# Patient Record
Sex: Female | Born: 1950 | Race: White | Hispanic: No | Marital: Single | Smoking: Never smoker
Health system: Southern US, Community
[De-identification: ages and names within clinical notes are randomized; demographics above are authoritative.]

## PROBLEM LIST (undated history)

## (undated) DIAGNOSIS — R51 Headache: Secondary | ICD-10-CM

## (undated) DIAGNOSIS — M65312 Trigger thumb, left thumb: Secondary | ICD-10-CM

## (undated) DIAGNOSIS — R112 Nausea with vomiting, unspecified: Secondary | ICD-10-CM

## (undated) DIAGNOSIS — H04123 Dry eye syndrome of bilateral lacrimal glands: Secondary | ICD-10-CM

## (undated) DIAGNOSIS — Z9889 Other specified postprocedural states: Secondary | ICD-10-CM

## (undated) DIAGNOSIS — E559 Vitamin D deficiency, unspecified: Secondary | ICD-10-CM

## (undated) DIAGNOSIS — J45909 Unspecified asthma, uncomplicated: Secondary | ICD-10-CM

## (undated) DIAGNOSIS — M549 Dorsalgia, unspecified: Secondary | ICD-10-CM

## (undated) DIAGNOSIS — E663 Overweight: Secondary | ICD-10-CM

## (undated) DIAGNOSIS — R519 Headache, unspecified: Secondary | ICD-10-CM

## (undated) DIAGNOSIS — M1990 Unspecified osteoarthritis, unspecified site: Secondary | ICD-10-CM

## (undated) DIAGNOSIS — G43909 Migraine, unspecified, not intractable, without status migrainosus: Secondary | ICD-10-CM

## (undated) HISTORY — DX: Dorsalgia, unspecified: M54.9

## (undated) HISTORY — DX: Overweight: E66.3

## (undated) HISTORY — PX: ABDOMINOPLASTY: SUR9

## (undated) HISTORY — DX: Dry eye syndrome of bilateral lacrimal glands: H04.123

## (undated) HISTORY — PX: TYMPANOSTOMY: SHX2586

## (undated) HISTORY — DX: Migraine, unspecified, not intractable, without status migrainosus: G43.909

## (undated) HISTORY — DX: Vitamin D deficiency, unspecified: E55.9

## (undated) HISTORY — PX: FACIAL COSMETIC SURGERY: SHX629

---

## 1998-11-02 ENCOUNTER — Other Ambulatory Visit: Admission: RE | Admit: 1998-11-02 | Discharge: 1998-11-02 | Payer: Self-pay | Admitting: Family Medicine

## 1998-12-06 ENCOUNTER — Ambulatory Visit (HOSPITAL_COMMUNITY): Admission: RE | Admit: 1998-12-06 | Discharge: 1998-12-06 | Payer: Self-pay | Admitting: Gastroenterology

## 1999-03-22 ENCOUNTER — Ambulatory Visit (HOSPITAL_BASED_OUTPATIENT_CLINIC_OR_DEPARTMENT_OTHER): Admission: RE | Admit: 1999-03-22 | Discharge: 1999-03-22 | Payer: Self-pay | Admitting: Plastic Surgery

## 1999-11-08 ENCOUNTER — Other Ambulatory Visit: Admission: RE | Admit: 1999-11-08 | Discharge: 1999-11-08 | Payer: Self-pay | Admitting: Family Medicine

## 2000-10-28 ENCOUNTER — Other Ambulatory Visit: Admission: RE | Admit: 2000-10-28 | Discharge: 2000-10-28 | Payer: Self-pay | Admitting: Family Medicine

## 2001-11-11 ENCOUNTER — Other Ambulatory Visit: Admission: RE | Admit: 2001-11-11 | Discharge: 2001-11-11 | Payer: Self-pay | Admitting: Family Medicine

## 2002-11-26 ENCOUNTER — Other Ambulatory Visit: Admission: RE | Admit: 2002-11-26 | Discharge: 2002-11-26 | Payer: Self-pay | Admitting: Family Medicine

## 2004-02-27 ENCOUNTER — Other Ambulatory Visit: Admission: RE | Admit: 2004-02-27 | Discharge: 2004-02-27 | Payer: Self-pay | Admitting: Family Medicine

## 2005-04-01 ENCOUNTER — Other Ambulatory Visit: Admission: RE | Admit: 2005-04-01 | Discharge: 2005-04-01 | Payer: Self-pay | Admitting: Family Medicine

## 2005-04-18 ENCOUNTER — Encounter: Admission: RE | Admit: 2005-04-18 | Discharge: 2005-04-18 | Payer: Self-pay | Admitting: Family Medicine

## 2006-05-28 ENCOUNTER — Other Ambulatory Visit: Admission: RE | Admit: 2006-05-28 | Discharge: 2006-05-28 | Payer: Self-pay | Admitting: Family Medicine

## 2008-09-05 ENCOUNTER — Other Ambulatory Visit: Admission: RE | Admit: 2008-09-05 | Discharge: 2008-09-05 | Payer: Self-pay | Admitting: Family Medicine

## 2013-06-09 ENCOUNTER — Other Ambulatory Visit (HOSPITAL_COMMUNITY)
Admission: RE | Admit: 2013-06-09 | Discharge: 2013-06-09 | Disposition: A | Discharge status: Home / Self Care | Payer: 59 | Source: Ambulatory Visit | Attending: Family Medicine | Admitting: Family Medicine

## 2013-06-09 ENCOUNTER — Other Ambulatory Visit: Payer: Self-pay | Admitting: Family Medicine

## 2013-06-09 DIAGNOSIS — Z1151 Encounter for screening for human papillomavirus (HPV): Secondary | ICD-10-CM | POA: Insufficient documentation

## 2013-06-09 DIAGNOSIS — R1011 Right upper quadrant pain: Secondary | ICD-10-CM

## 2013-06-09 DIAGNOSIS — Z01419 Encounter for gynecological examination (general) (routine) without abnormal findings: Secondary | ICD-10-CM | POA: Insufficient documentation

## 2013-06-24 ENCOUNTER — Ambulatory Visit
Admission: RE | Admit: 2013-06-24 | Discharge: 2013-06-24 | Disposition: A | Discharge status: Home / Self Care | Payer: 59 | Source: Ambulatory Visit | Attending: Family Medicine | Admitting: Family Medicine

## 2013-06-24 DIAGNOSIS — R1011 Right upper quadrant pain: Secondary | ICD-10-CM

## 2014-03-17 ENCOUNTER — Other Ambulatory Visit: Payer: Self-pay | Admitting: Family Medicine

## 2014-03-17 DIAGNOSIS — I890 Lymphedema, not elsewhere classified: Secondary | ICD-10-CM

## 2014-03-21 ENCOUNTER — Ambulatory Visit
Admission: RE | Admit: 2014-03-21 | Discharge: 2014-03-21 | Disposition: A | Discharge status: Home / Self Care | Payer: 59 | Source: Ambulatory Visit | Attending: Family Medicine | Admitting: Family Medicine

## 2014-03-21 ENCOUNTER — Other Ambulatory Visit: Payer: Self-pay | Admitting: Family Medicine

## 2014-03-21 DIAGNOSIS — I890 Lymphedema, not elsewhere classified: Secondary | ICD-10-CM

## 2014-03-21 MED ORDER — IOHEXOL 300 MG/ML  SOLN
125.0000 mL | Freq: Once | INTRAMUSCULAR | Status: AC | PRN
  Administered 2014-03-21: 125 mL via INTRAVENOUS

## 2015-01-27 IMAGING — CT CT ABD-PELV W/ CM
2 of 5 series · 17 of 46 positions shown, 19 images · IV contrast (READICAT/WATER & [ID] OMNI 300)
Comparison: None.

CLINICAL DATA: Bilateral lower extremity lymphedema

EXAM:
CT ABDOMEN AND PELVIS WITH CONTRAST
TECHNIQUE: Multidetector CT imaging of the abdomen and pelvis was performed
using the standard protocol following bolus administration of
intravenous contrast.
CONTRAST:  125mL OMNIPAQUE IOHEXOL 300 MG/ML  SOLN

[Series 2: abd/pelvis with · axial · 0.74mm/px · z∈[-439,-34]mm · 14 of 90 slices shown, 16 images]
[im 5/90  soft-tissue]
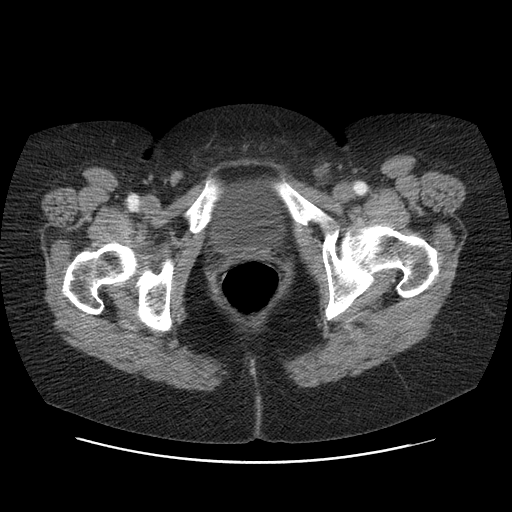
[im 5/90  bone]
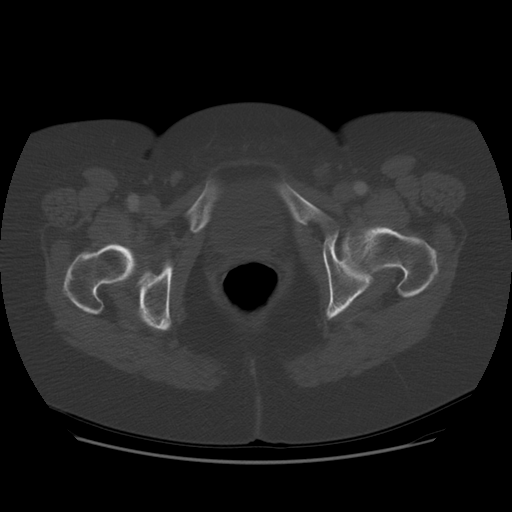
[im 10/90  soft-tissue]
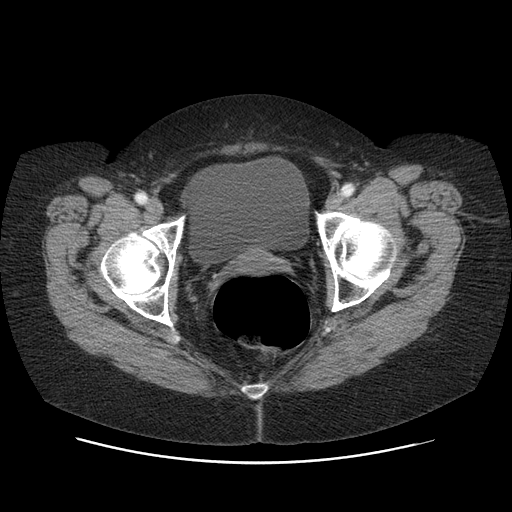
[im 20/90  soft-tissue]
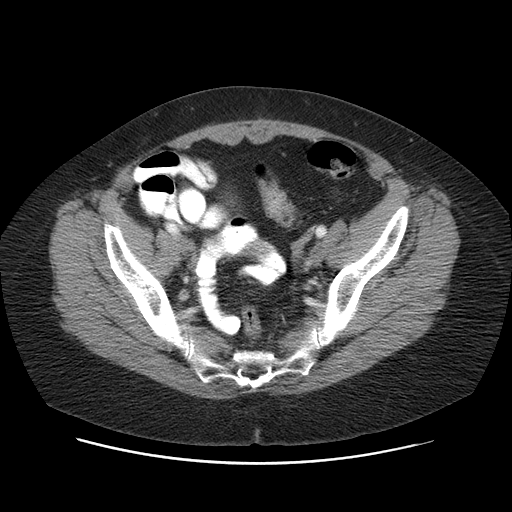
[im 25/90  soft-tissue]
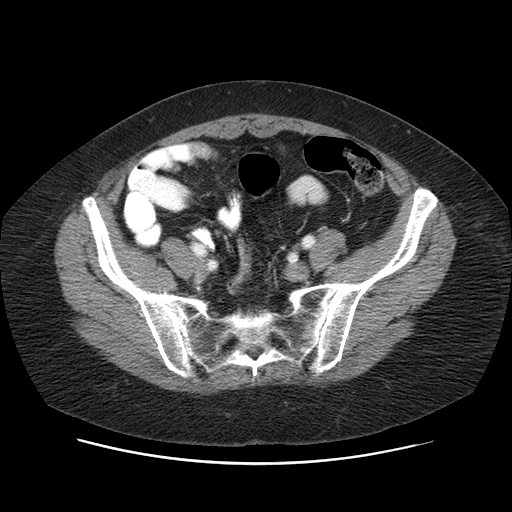
[im 30/90  soft-tissue]
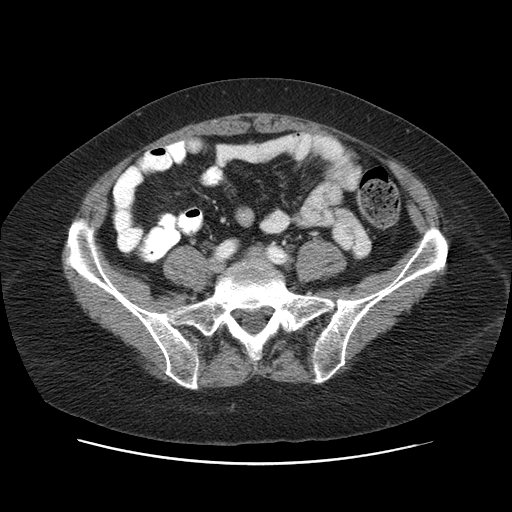
[im 35/90  soft-tissue]
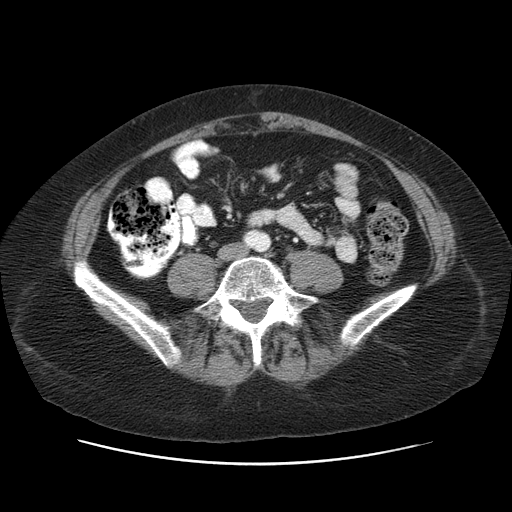
[im 40/90  soft-tissue]
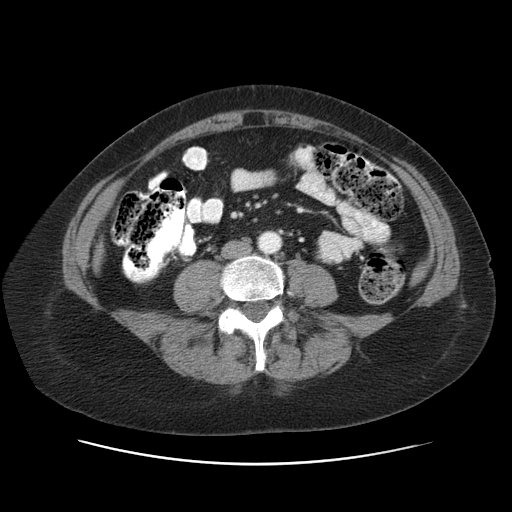
[im 50/90  soft-tissue]
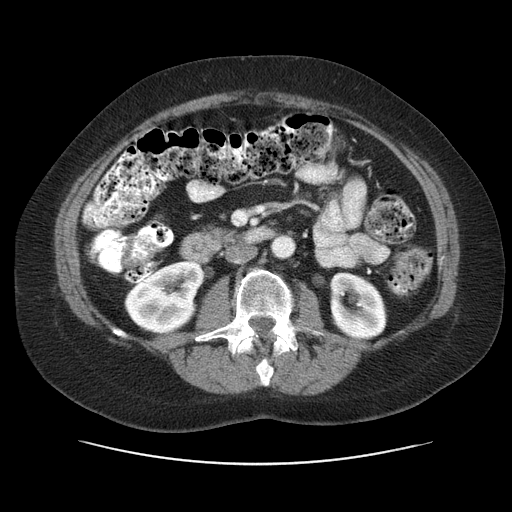
[im 55/90  soft-tissue]
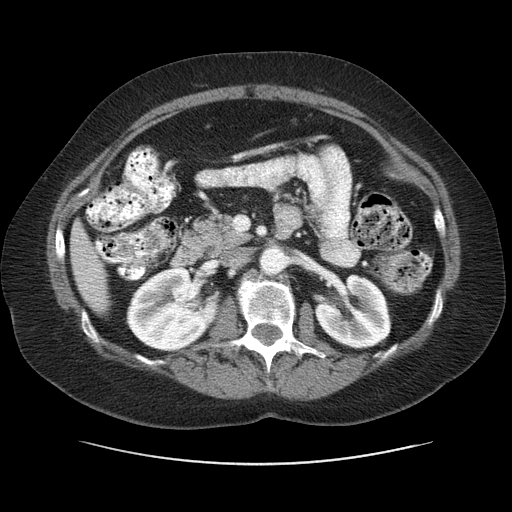
[im 55/90  bone]
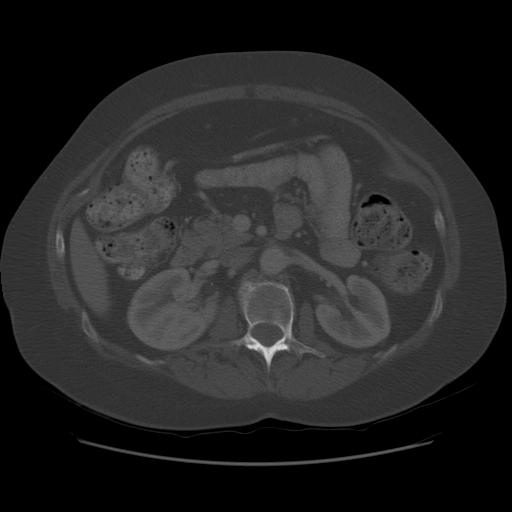
[im 60/90  soft-tissue]
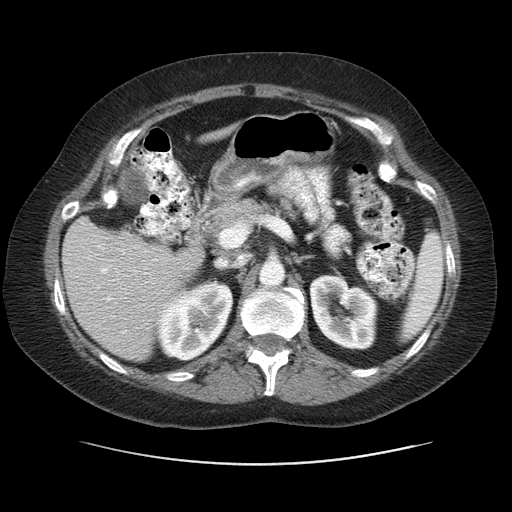
[im 65/90  soft-tissue]
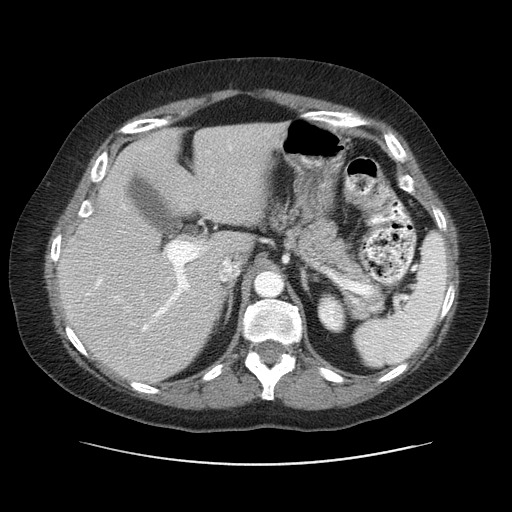
[im 70/90  soft-tissue]
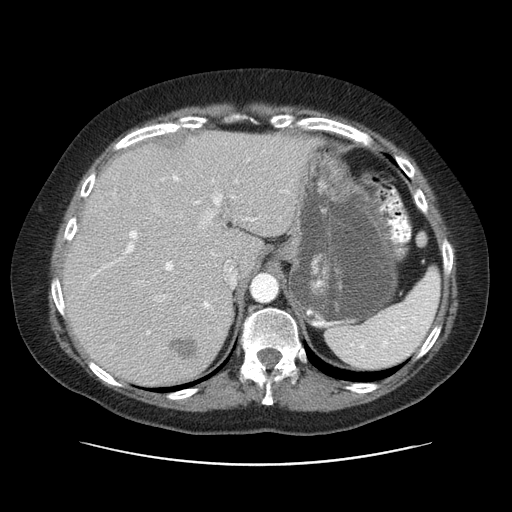
[im 80/90  soft-tissue]
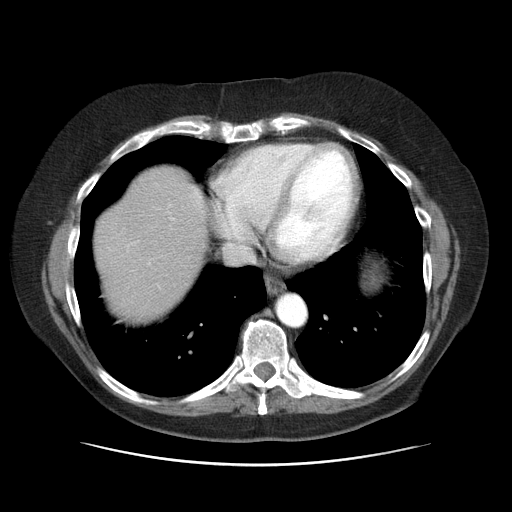
[im 85/90  soft-tissue]
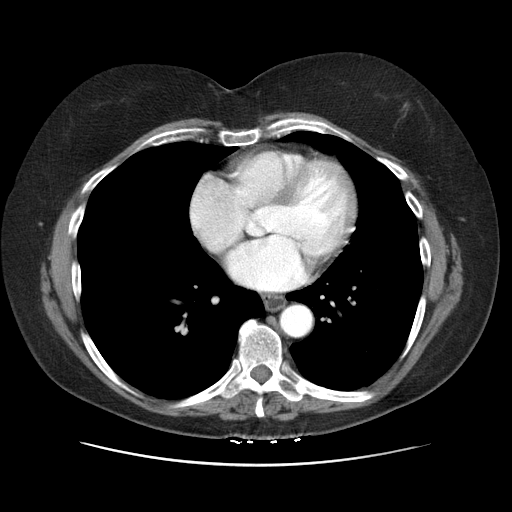

[Series 400: cor · coronal · 1.02mm/px · 3 of 134 slices shown]
[im 45/134  soft-tissue]
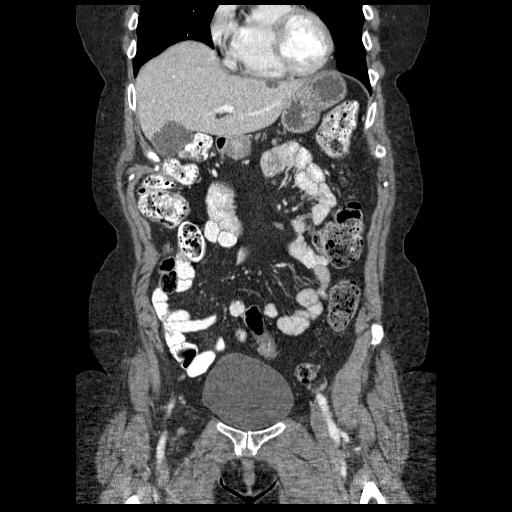
[im 60/134  soft-tissue]
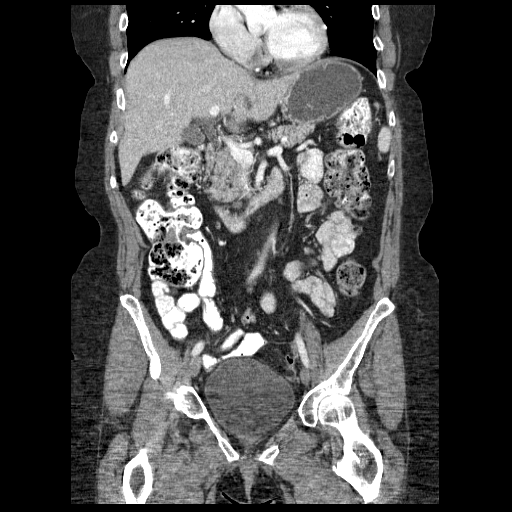
[im 74/134  soft-tissue]
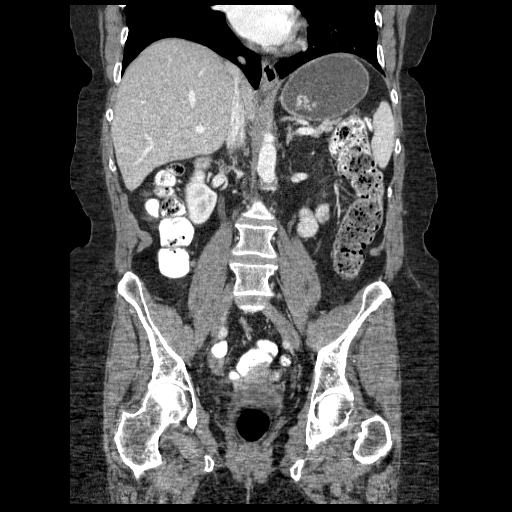

[17 of 46 positions shown; findings below may reference images not displayed]

FINDINGS: Lung bases are unremarkable. Small hiatal hernia. Heart size is
within normal limits. There is a cyst in left hepatic lobe measures
1.3 cm. Probable hemangioma in the right hepatic lobe posteriorly
measures 1.6 cm. Additional tiny hepatic cysts are noted. The
pancreas, spleen and adrenal glands are unremarkable. Small
accessory splenule. No aortic aneurysm. Kidneys are symmetrical in
size and enhancement. No hydronephrosis or hydroureter.

Delayed renal images shows bilateral renal symmetrical excretion.

Moderate colonic stool. No small bowel obstruction. No pericecal
inflammation. The terminal ileum is unremarkable. The appendix is
not identified. Bilateral visualized proximal ureter is
unremarkable.

No ascites or free air. No adenopathy. The uterus and adnexa are
unremarkable. The urinary bladder is unremarkable. Small nonspecific
bilateral inguinal lymph nodes. No pelvic masses noted. No distal
colonic obstruction.

Sagittal images of the spine shows mild degenerative changes
thoracolumbar spine. No destructive bony lesions are noted.
IMPRESSION: 1. No acute inflammatory process within abdomen or pelvis.
2. Moderate colonic stool.
3. Scattered hepatic cysts. Probable hemangioma right hepatic lobe
measures 1.6 cm.
4. No hydronephrosis or hydroureter.
5. No pericecal inflammation.

## 2016-07-02 ENCOUNTER — Other Ambulatory Visit: Payer: Self-pay | Admitting: Family Medicine

## 2016-07-02 DIAGNOSIS — R1011 Right upper quadrant pain: Secondary | ICD-10-CM

## 2016-07-12 ENCOUNTER — Ambulatory Visit
Admission: RE | Admit: 2016-07-12 | Discharge: 2016-07-12 | Disposition: A | Discharge status: Home / Self Care | Payer: 59 | Source: Ambulatory Visit | Attending: Family Medicine | Admitting: Family Medicine

## 2016-07-12 DIAGNOSIS — R1011 Right upper quadrant pain: Secondary | ICD-10-CM

## 2017-05-20 IMAGING — US US ABDOMEN COMPLETE
1 series · 14 of 25 positions shown · non-contrast
Comparison: CT abdomen pelvis of 03/21/2014 and ultrasound of the
abdomen of 06/24/2013

CLINICAL DATA: Intermittent right upper quadrant pain particularly
after eating

EXAM:
ABDOMEN ULTRASOUND COMPLETE

[Series 1: us abdomen complete · 0.30mm/px · 14 of 78 slices shown]
[im 1/78]
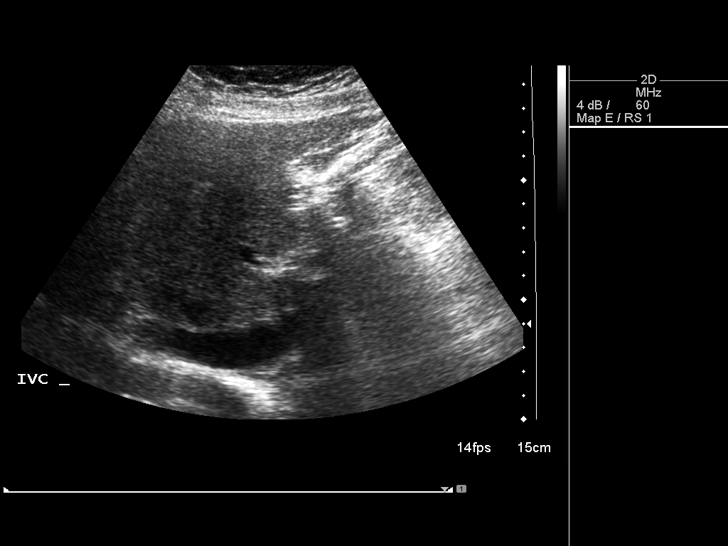
[im 7/78]
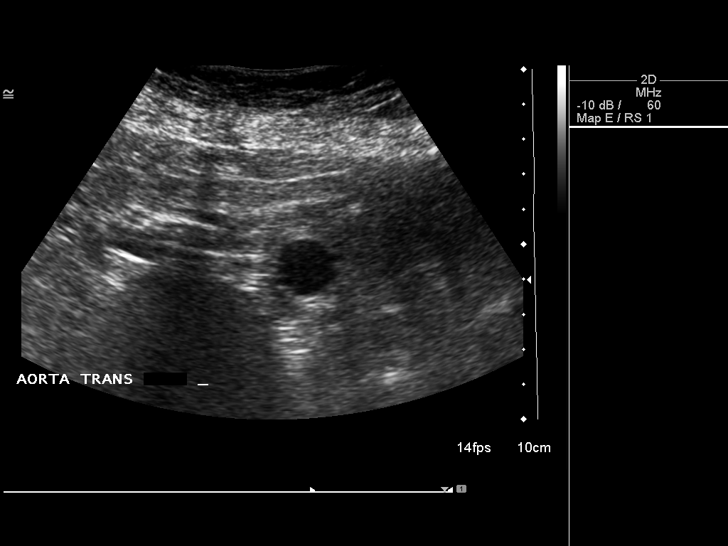
[im 13/78]
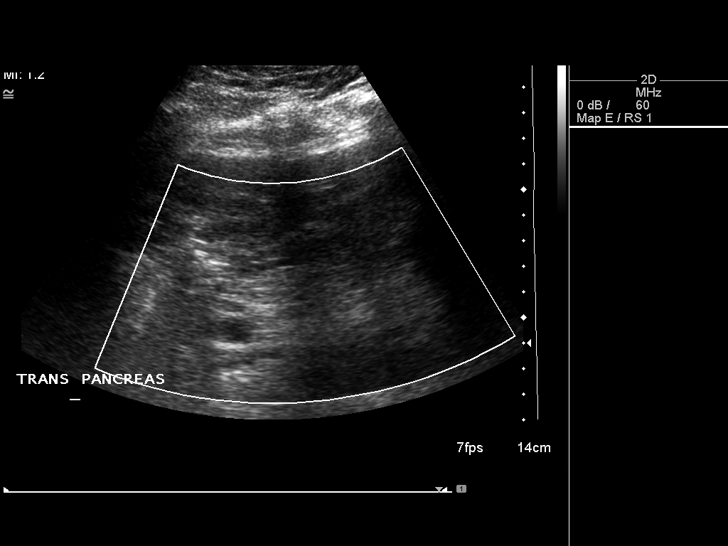
[im 20/78]
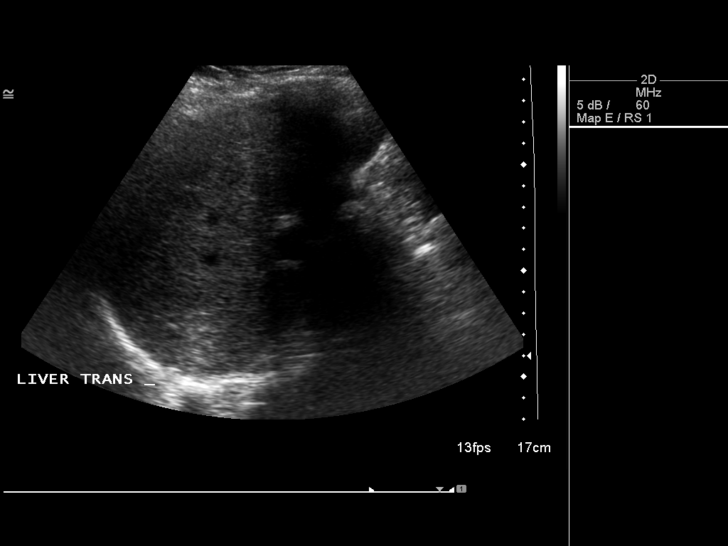
[im 26/78]
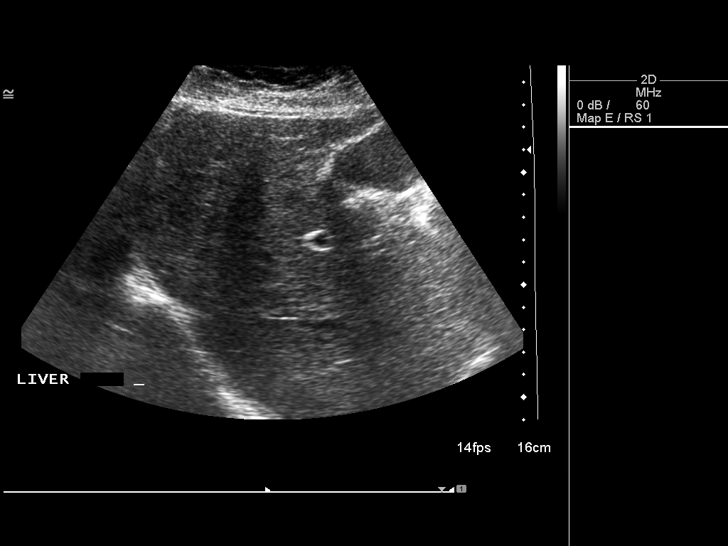
[im 29/78]
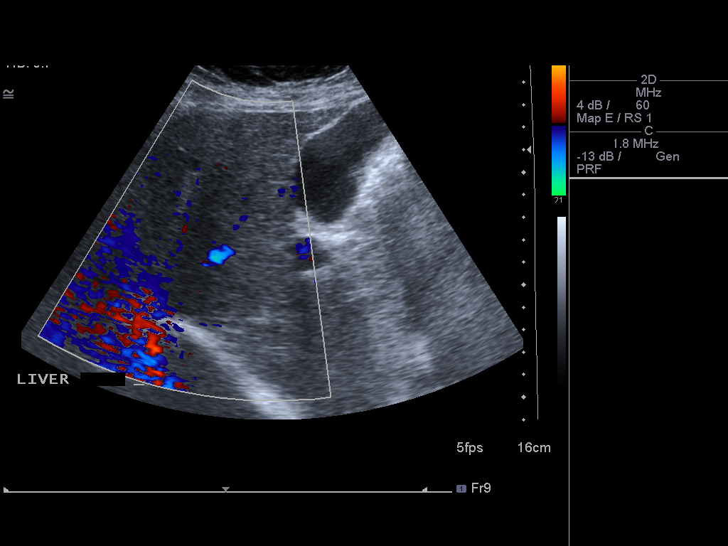
[im 36/78]
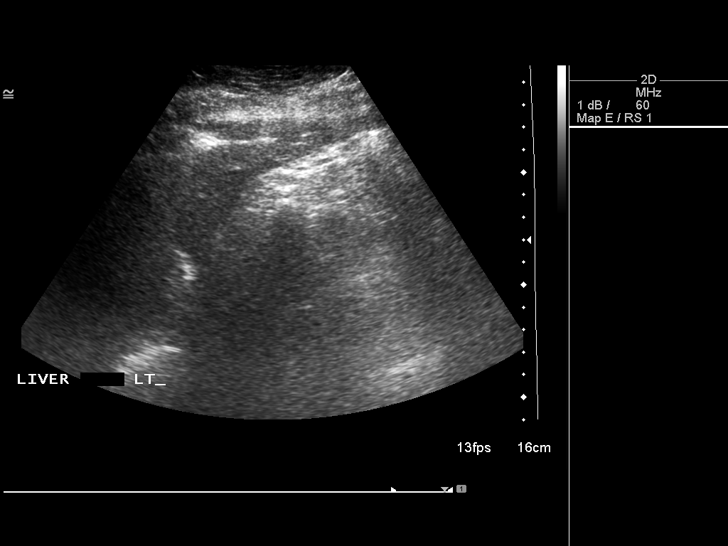
[im 42/78]
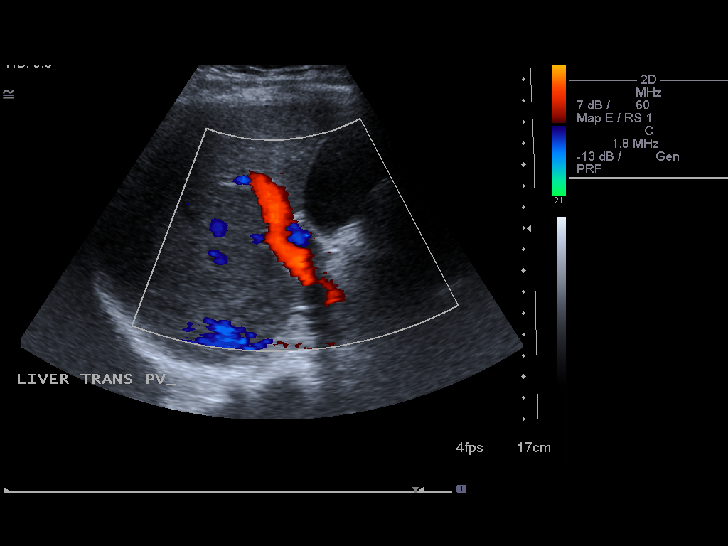
[im 49/78]
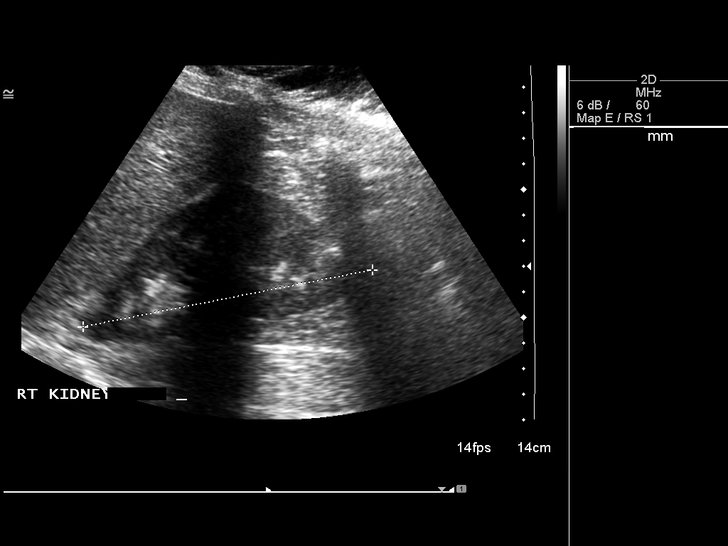
[im 52/78]
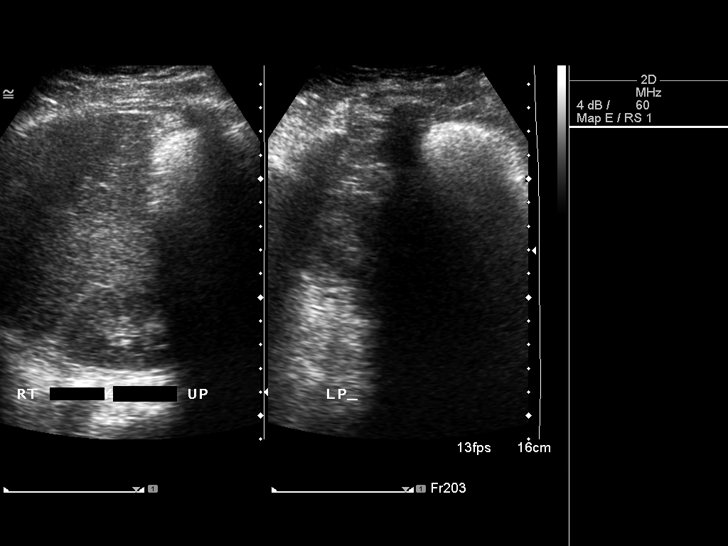
[im 58/78]
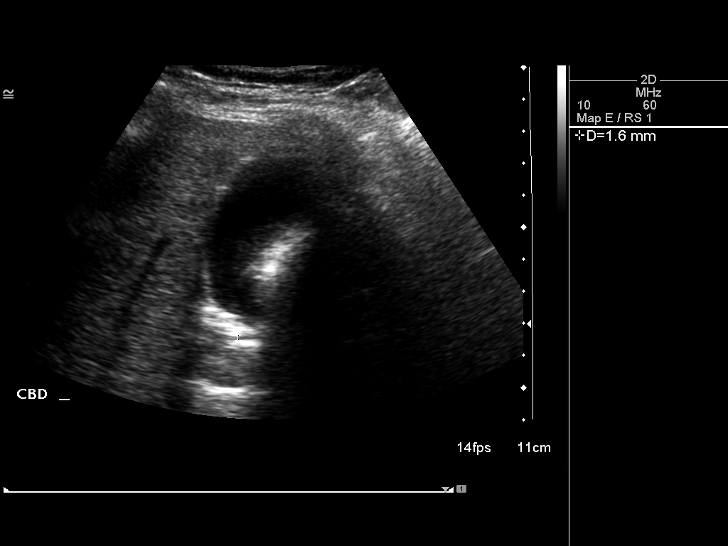
[im 65/78]
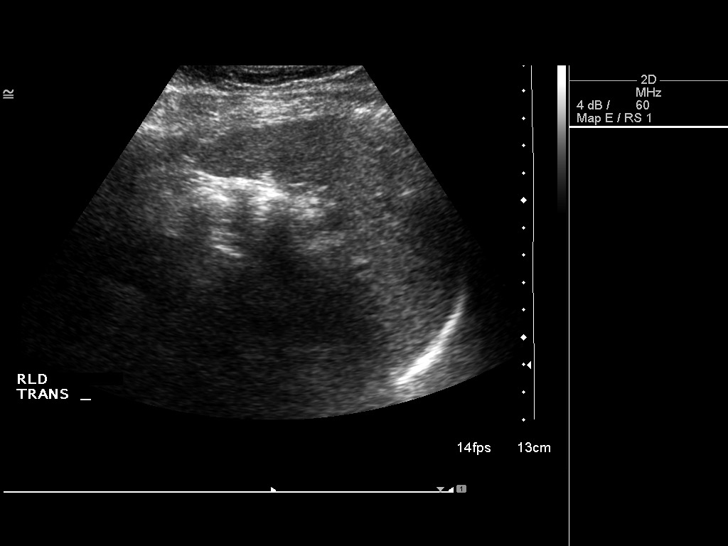
[im 71/78]
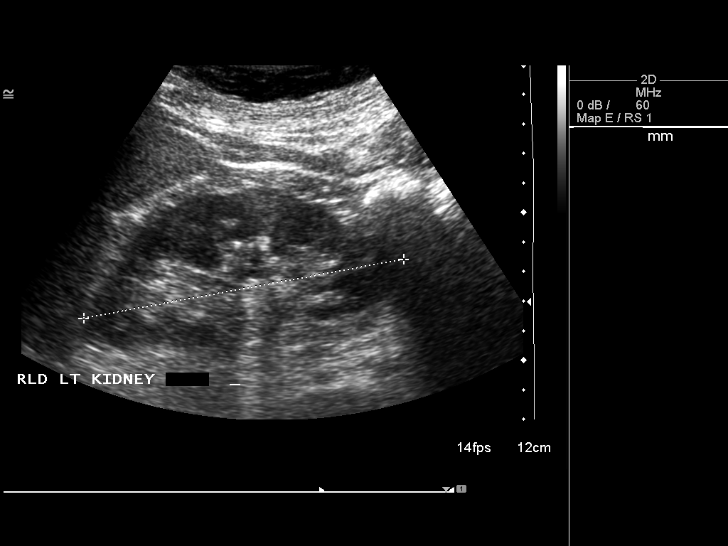
[im 78/78]
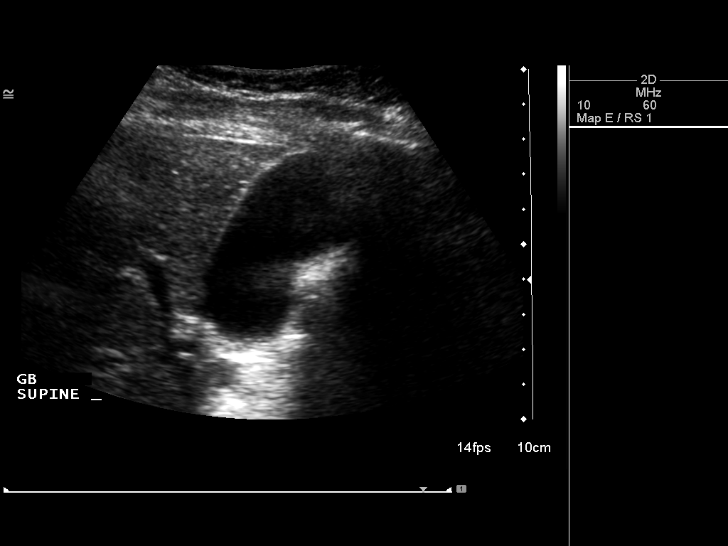

[14 of 25 positions shown; findings below may reference images not displayed]

FINDINGS: Gallbladder: The gallbladder is visualized and no gallstones are
noted. There is no pain over the gallbladder with compression.

Common bile duct: Diameter: The common bile duct measures 1.6 mm in
diameter.

Liver: Several liver cysts are present. Also there appears to be
hemangioma posteriorly in the right lobe near the dome of 2.6 cm
diameter, seen previously by CT.

IVC: The IVC is not well seen due to bowel gas.

Pancreas: Also much of the pancreas is obscured by bowel gas.

Spleen: Spleen measures 8.9 cm.

Right Kidney: Length: 11.8 cm..  No hydronephrosis is seen.

Left Kidney: Length: 11.0 cm..  No hydronephrosis is noted.

Abdominal aorta: The abdominal aorta is normal in caliber.

Other findings: None.
IMPRESSION: 1. No gallstones.
2. No change in hepatic cysts and apparent hemangioma posteriorly in
the right lobe.
3. The pancreas is obscured by bowel gas.

## 2018-07-20 ENCOUNTER — Other Ambulatory Visit: Payer: Self-pay | Admitting: Family Medicine

## 2018-07-20 ENCOUNTER — Other Ambulatory Visit (HOSPITAL_COMMUNITY)
Admission: RE | Admit: 2018-07-20 | Discharge: 2018-07-20 | Disposition: A | Discharge status: Home / Self Care | Payer: Medicare Other | Source: Ambulatory Visit | Attending: Family Medicine | Admitting: Family Medicine

## 2018-07-20 DIAGNOSIS — Z124 Encounter for screening for malignant neoplasm of cervix: Secondary | ICD-10-CM | POA: Diagnosis present

## 2018-07-22 LAB — CYTOLOGY - PAP
Diagnosis: NEGATIVE
HPV: NOT DETECTED

## 2018-08-03 ENCOUNTER — Other Ambulatory Visit: Payer: Self-pay | Admitting: Plastic Surgery

## 2018-08-03 HISTORY — PX: BREAST REDUCTION SURGERY: SHX8

## 2018-09-10 ENCOUNTER — Other Ambulatory Visit: Payer: Self-pay | Admitting: Orthopedic Surgery

## 2018-09-25 ENCOUNTER — Encounter (HOSPITAL_BASED_OUTPATIENT_CLINIC_OR_DEPARTMENT_OTHER): Payer: Self-pay | Admitting: *Deleted

## 2018-09-25 ENCOUNTER — Other Ambulatory Visit: Payer: Self-pay

## 2018-10-05 ENCOUNTER — Encounter (HOSPITAL_BASED_OUTPATIENT_CLINIC_OR_DEPARTMENT_OTHER): Payer: Self-pay | Admitting: *Deleted

## 2018-10-05 ENCOUNTER — Encounter (HOSPITAL_BASED_OUTPATIENT_CLINIC_OR_DEPARTMENT_OTHER)
Admission: RE | Disposition: A | Discharge status: Home / Self Care | Payer: Self-pay | Source: Home / Self Care | Attending: Orthopedic Surgery

## 2018-10-05 ENCOUNTER — Ambulatory Visit (HOSPITAL_BASED_OUTPATIENT_CLINIC_OR_DEPARTMENT_OTHER): Payer: Medicare Other | Admitting: Anesthesiology

## 2018-10-05 ENCOUNTER — Ambulatory Visit (HOSPITAL_BASED_OUTPATIENT_CLINIC_OR_DEPARTMENT_OTHER)
Admission: RE | Admit: 2018-10-05 | Discharge: 2018-10-05 | Disposition: A | Discharge status: Home / Self Care | Payer: Medicare Other | Attending: Orthopedic Surgery | Admitting: Orthopedic Surgery

## 2018-10-05 DIAGNOSIS — M1990 Unspecified osteoarthritis, unspecified site: Secondary | ICD-10-CM | POA: Insufficient documentation

## 2018-10-05 DIAGNOSIS — Z6833 Body mass index (BMI) 33.0-33.9, adult: Secondary | ICD-10-CM | POA: Diagnosis not present

## 2018-10-05 DIAGNOSIS — E669 Obesity, unspecified: Secondary | ICD-10-CM | POA: Insufficient documentation

## 2018-10-05 DIAGNOSIS — J45909 Unspecified asthma, uncomplicated: Secondary | ICD-10-CM | POA: Diagnosis not present

## 2018-10-05 DIAGNOSIS — M65312 Trigger thumb, left thumb: Secondary | ICD-10-CM | POA: Insufficient documentation

## 2018-10-05 HISTORY — DX: Other specified postprocedural states: Z98.890

## 2018-10-05 HISTORY — DX: Unspecified osteoarthritis, unspecified site: M19.90

## 2018-10-05 HISTORY — DX: Unspecified asthma, uncomplicated: J45.909

## 2018-10-05 HISTORY — DX: Headache: R51

## 2018-10-05 HISTORY — DX: Trigger thumb, left thumb: M65.312

## 2018-10-05 HISTORY — DX: Headache, unspecified: R51.9

## 2018-10-05 HISTORY — DX: Nausea with vomiting, unspecified: R11.2

## 2018-10-05 HISTORY — PX: TRIGGER FINGER RELEASE: SHX641

## 2018-10-05 SURGERY — RELEASE, A1 PULLEY, FOR TRIGGER FINGER
Anesthesia: Monitor Anesthesia Care | Site: Thumb | Laterality: Left

## 2018-10-05 MED ORDER — MIDAZOLAM HCL 2 MG/2ML IJ SOLN
1.0000 mg | INTRAMUSCULAR | Status: DC | PRN
  Administered 2018-10-05: 2 mg via INTRAVENOUS

## 2018-10-05 MED ORDER — LIDOCAINE 2% (20 MG/ML) 5 ML SYRINGE
INTRAMUSCULAR | Status: AC
  Filled 2018-10-05: qty 5

## 2018-10-05 MED ORDER — FENTANYL CITRATE (PF) 100 MCG/2ML IJ SOLN
INTRAMUSCULAR | Status: AC
  Filled 2018-10-05: qty 2

## 2018-10-05 MED ORDER — LACTATED RINGERS IV SOLN
INTRAVENOUS | Status: DC
  Administered 2018-10-05: 13:00:00 via INTRAVENOUS

## 2018-10-05 MED ORDER — HYDROCODONE-ACETAMINOPHEN 5-325 MG PO TABS: ORAL_TABLET | ORAL | 0 refills | Status: DC

## 2018-10-05 MED ORDER — ONDANSETRON HCL 4 MG/2ML IJ SOLN
INTRAMUSCULAR | Status: DC | PRN
  Administered 2018-10-05: 4 mg via INTRAVENOUS

## 2018-10-05 MED ORDER — SCOPOLAMINE 1 MG/3DAYS TD PT72: 1.0000 | MEDICATED_PATCH | Freq: Once | TRANSDERMAL | Status: DC | PRN

## 2018-10-05 MED ORDER — LIDOCAINE HCL (PF) 0.5 % IJ SOLN: INTRAMUSCULAR | Status: DC | PRN

## 2018-10-05 MED ORDER — CEFAZOLIN SODIUM-DEXTROSE 2-4 GM/100ML-% IV SOLN
2.0000 g | INTRAVENOUS | Status: AC
  Administered 2018-10-05: 2 g via INTRAVENOUS

## 2018-10-05 MED ORDER — ONDANSETRON HCL 4 MG/2ML IJ SOLN: 4.0000 mg | Freq: Once | INTRAMUSCULAR | Status: DC | PRN

## 2018-10-05 MED ORDER — FENTANYL CITRATE (PF) 100 MCG/2ML IJ SOLN: 25.0000 ug | INTRAMUSCULAR | Status: DC | PRN

## 2018-10-05 MED ORDER — MIDAZOLAM HCL 2 MG/2ML IJ SOLN
INTRAMUSCULAR | Status: AC
  Filled 2018-10-05: qty 2

## 2018-10-05 MED ORDER — ONDANSETRON HCL 4 MG/2ML IJ SOLN
INTRAMUSCULAR | Status: AC
  Filled 2018-10-05: qty 2

## 2018-10-05 MED ORDER — CHLORHEXIDINE GLUCONATE 4 % EX LIQD: 60.0000 mL | Freq: Once | CUTANEOUS | Status: DC

## 2018-10-05 MED ORDER — BUPIVACAINE HCL (PF) 0.25 % IJ SOLN
INTRAMUSCULAR | Status: DC | PRN
  Administered 2018-10-05: 5 mL

## 2018-10-05 MED ORDER — ACETAMINOPHEN 500 MG PO TABS
1000.0000 mg | ORAL_TABLET | Freq: Once | ORAL | Status: AC
  Administered 2018-10-05: 1000 mg via ORAL

## 2018-10-05 MED ORDER — ACETAMINOPHEN 500 MG PO TABS
ORAL_TABLET | ORAL | Status: AC
  Filled 2018-10-05: qty 2

## 2018-10-05 MED ORDER — CEFAZOLIN SODIUM-DEXTROSE 2-4 GM/100ML-% IV SOLN
INTRAVENOUS | Status: AC
  Filled 2018-10-05: qty 100

## 2018-10-05 MED ORDER — LIDOCAINE HCL (PF) 0.5 % IJ SOLN
INTRAMUSCULAR | Status: DC | PRN
  Administered 2018-10-05: 30 mL via INTRAVENOUS

## 2018-10-05 MED ORDER — PROPOFOL 10 MG/ML IV BOLUS
INTRAVENOUS | Status: AC
  Filled 2018-10-05: qty 20

## 2018-10-05 MED ORDER — PROPOFOL 10 MG/ML IV BOLUS
INTRAVENOUS | Status: DC | PRN
  Administered 2018-10-05 (×3): 20 mg via INTRAVENOUS

## 2018-10-05 MED ORDER — FENTANYL CITRATE (PF) 100 MCG/2ML IJ SOLN
50.0000 ug | INTRAMUSCULAR | Status: DC | PRN
  Administered 2018-10-05 (×2): 50 ug via INTRAVENOUS

## 2018-10-05 SURGICAL SUPPLY — 31 items
BANDAGE COBAN STERILE 2 (GAUZE/BANDAGES/DRESSINGS) ×1 IMPLANT
BLADE SURG 15 STRL LF DISP TIS (BLADE) ×2 IMPLANT
BLADE SURG 15 STRL SS (BLADE) ×6
BNDG CMPR 9X4 STRL LF SNTH (GAUZE/BANDAGES/DRESSINGS) ×1
BNDG ESMARK 4X9 LF (GAUZE/BANDAGES/DRESSINGS) ×2 IMPLANT
CHLORAPREP W/TINT 26ML (MISCELLANEOUS) ×3 IMPLANT
CORD BIPOLAR FORCEPS 12FT (ELECTRODE) ×3 IMPLANT
COVER BACK TABLE 60X90IN (DRAPES) ×3 IMPLANT
COVER MAYO STAND STRL (DRAPES) ×3 IMPLANT
COVER WAND RF STERILE (DRAPES) IMPLANT
CUFF TOURNIQUET SINGLE 18IN (TOURNIQUET CUFF) ×3 IMPLANT
DRAPE EXTREMITY T 121X128X90 (DISPOSABLE) ×3 IMPLANT
DRAPE SURG 17X23 STRL (DRAPES) ×3 IMPLANT
GAUZE SPONGE 4X4 12PLY STRL (GAUZE/BANDAGES/DRESSINGS) ×3 IMPLANT
GAUZE XEROFORM 1X8 LF (GAUZE/BANDAGES/DRESSINGS) ×3 IMPLANT
GLOVE BIO SURGEON STRL SZ7.5 (GLOVE) ×3 IMPLANT
GLOVE BIOGEL PI IND STRL 8 (GLOVE) ×1 IMPLANT
GLOVE BIOGEL PI INDICATOR 8 (GLOVE) ×2
GOWN STRL REUS W/ TWL LRG LVL3 (GOWN DISPOSABLE) ×1 IMPLANT
GOWN STRL REUS W/TWL LRG LVL3 (GOWN DISPOSABLE) ×3
GOWN STRL REUS W/TWL XL LVL3 (GOWN DISPOSABLE) ×3 IMPLANT
NDL HYPO 25X1 1.5 SAFETY (NEEDLE) ×1 IMPLANT
NEEDLE HYPO 25X1 1.5 SAFETY (NEEDLE) ×3 IMPLANT
NS IRRIG 1000ML POUR BTL (IV SOLUTION) ×3 IMPLANT
PACK BASIN DAY SURGERY FS (CUSTOM PROCEDURE TRAY) ×3 IMPLANT
STOCKINETTE 4X48 STRL (DRAPES) ×3 IMPLANT
SUT ETHILON 4 0 PS 2 18 (SUTURE) ×3 IMPLANT
SYR BULB 3OZ (MISCELLANEOUS) ×3 IMPLANT
SYR CONTROL 10ML LL (SYRINGE) ×3 IMPLANT
TOWEL GREEN STERILE FF (TOWEL DISPOSABLE) ×6 IMPLANT
UNDERPAD 30X30 (UNDERPADS AND DIAPERS) ×3 IMPLANT

## 2018-10-05 NOTE — Discharge Instructions (Addendum)

## 2018-10-05 NOTE — Anesthesia Preprocedure Evaluation (Addendum)
Anesthesia Evaluation  Patient identified by MRN, date of birth, ID band Patient awake    Reviewed: Allergy & Precautions, NPO status , Patient's Chart, lab work & pertinent test results  History of Anesthesia Complications (+) PONV and history of anesthetic complications  Airway Mallampati: I  TM Distance: >3 FB Neck ROM: Full    Dental  (+) Teeth Intact, Dental Advisory Given   Pulmonary asthma ,    Pulmonary exam normal breath sounds clear to auscultation       Cardiovascular Exercise Tolerance: Good (-) hypertension(-) angina(-) CAD, (-) Past MI, (-) Cardiac Stents and (-) CABG negative cardio ROS Normal cardiovascular exam Rhythm:Regular Rate:Normal     Neuro/Psych  Headaches,    GI/Hepatic negative GI ROS, Neg liver ROS,   Endo/Other  Obesity   Renal/GU negative Renal ROS     Musculoskeletal  (+) Arthritis , Osteoarthritis,   LEFT THUMB TRIGGER DIGIT   Abdominal   Peds  Hematology negative hematology ROS (+)   Anesthesia Other Findings Day of surgery medications reviewed with the patient.  Reproductive/Obstetrics                            Anesthesia Physical Anesthesia Plan  ASA: II  Anesthesia Plan: MAC and Bier Block and Bier Block-LIDOCAINE ONLY   Post-op Pain Management:    Induction: Intravenous  PONV Risk Score and Plan: 3 and Propofol infusion and Treatment may vary due to age or medical condition  Airway Management Planned: Nasal Cannula and Natural Airway  Additional Equipment:   Intra-op Plan:   Post-operative Plan:   Informed Consent: I have reviewed the patients History and Physical, chart, labs and discussed the procedure including the risks, benefits and alternatives for the proposed anesthesia with the patient or authorized representative who has indicated his/her understanding and acceptance.     Dental advisory given  Plan Discussed with:  CRNA  Anesthesia Plan Comments:        Anesthesia Quick Evaluation

## 2018-10-05 NOTE — H&P (Signed)
  Deanna Reese is an 68 y.o. female.   Chief Complaint: left thumb trigger digit HPI: 68 yo female with triggering left thumb.  This has been injected without lasting relief.  She wishes to have a trigger release.  Allergies: No Known Allergies  Past Medical History:  Diagnosis Date  . Arthritis   . Asthma   . Headache   . PONV (postoperative nausea and vomiting)   . Trigger thumb of left hand     Past Surgical History:  Procedure Laterality Date  . ABDOMINOPLASTY    . BREAST REDUCTION SURGERY  08/03/2018  . FACIAL COSMETIC SURGERY    . TYMPANOSTOMY Right     Family History: History reviewed. No pertinent family history.  Social History:   reports that she has never smoked. She has never used smokeless tobacco. She reports current alcohol use. She reports that she does not use drugs.  Medications: No medications prior to admission.    No results found for this or any previous visit (from the past 48 hour(s)).  No results found.   A comprehensive review of systems was negative.  Height 5\' 7"  (1.702 m), weight 96.2 kg.  General appearance: alert, cooperative and appears stated age Head: Normocephalic, without obvious abnormality, atraumatic Neck: supple, symmetrical, trachea midline Cardio: regular rate and rhythm Resp: clear to auscultation bilaterally Extremities: Intact sensation and capillary refill all digits.  +epl/fpl/io.  No wounds.  Pulses: 2+ and symmetric Skin: Skin color, texture, turgor normal. No rashes or lesions Neurologic: Grossly normal Incision/Wound: none  Assessment/Plan Left thumb trigger digit.  Non operative and operative treatment options have been discussed with the patient and patient wishes to proceed with operative treatment. Risks, benefits, and alternatives of surgery have been discussed and the patient agrees with the plan of care.   Betha Loa 10/05/2018, 12:03 PM

## 2018-10-05 NOTE — Op Note (Signed)
10/05/2018 [facility] OPERATIVE REPORT   PREOPERATIVE DIAGNOSIS: Left trigger thumb.  POSTOPERATIVE DIAGNOSIS:  Left trigger thumb.  PROCEDURE: Left trigger thumb release.  SURGEON:  Betha Loa, MD  ASSISTANT:  None.  ANESTHESIA:  Bier block with sedation.  IV FLUIDS:  Per anesthesia flow sheet.  ESTIMATED BLOOD LOSS:  Minimal.  COMPLICATIONS:  None.  SPECIMENS:  None.  TOURNIQUET TIME: Left forearm: 20 minutes at 250 mmHg  DISPOSITION:  Stable to PACU.  LOCATION: [facility]  INDICATIONS: Deanna Reese is a 68 y.o. female with triggering left thumb.  This has been injected without lasting relief.  She wishes to have a trigger release.  Risks, benefits and alternatives of surgery were discussed including the risk of blood loss, infection, damage to nerves, vessels, tendons, ligaments, bone, failure of surgery, need for additional surgery, complications with wound healing, continued pain, continued triggering and need for repeat surgery.  She voiced understanding of these risks and elected to proceed.  OPERATIVE COURSE:  After being identified preoperatively by myself, the patient and I agreed upon the procedure and site of procedure.  The surgical site was marked. The risks, benefits, and alternatives of surgery were reviewed and she wished to proceed.  Surgical consent had been signed. She was given IV Ancef as preoperative antibiotic prophylaxis. She was transported to the operating room and placed on the operating room table in supine position with the Left upper extremity on an arm board. Bier block anesthesia was induced by the anesthesiologist.  The Left upper extremity was prepped and draped in normal sterile orthopedic fashion. A surgical pause was performed between surgeons, anesthesia, and operating room staff, and all were in agreement as to the patient, procedure, and site of procedure.  Tourniquet at the proximal aspect of the forearm had  been inflated for the Bier block.  An incision was made transversely at the MP flexion crease of the thumb.  This was made through the skin only.  Spreading technique was used.  The radial and ulnar digital nerves were identified and were protected throughout the case. The flexor sheath was identified.  The A1 pulley was identified.  It was sharply incised.  It was released in its entirety.  Care was taken to avoid any release of the oblique pulley. The thumb was placed through a range of motion, there was noted to be no catching.  The wound was copiously irrigated with sterile saline. It was then closed with 4-0 nylon in a horizontal mattress fashion.  The wound was injected with  0.25% plain Marcaine to aid in postoperative analgesia.  It was then dressed with sterile Xeroform, 4x4s, and wrapped lightly with a Coban dressing.  Tourniquet was deflated at 20 minutes.  The fingertips were pink with brisk capillary refill after deflation of the tourniquet.  The operative drapes were broken down and the patient was awoken from anesthesia safely.  She was transferred back to the stretcher and taken to the PACU in stable condition.   I will see her back in the office in 1 week for postoperative followup.  I will give her a prescription for Norco 5/325 1-2 tabs PO q6 hours prn pain, dispense # 20.    Betha Loa, MD Electronically signed, 10/05/18

## 2018-10-05 NOTE — Transfer of Care (Signed)
Immediate Anesthesia Transfer of Care Note  Patient: Deanna Reese  Procedure(s) Performed: RELEASE TRIGGER FINGER/A-1 PULLEY LEFT THUMB (Left Thumb)  Patient Location: PACU  Anesthesia Type:Bier block  Level of Consciousness: awake, alert  and oriented  Airway & Oxygen Therapy: Patient Spontanous Breathing and Patient connected to face mask oxygen  Post-op Assessment: Report given to RN and Post -op Vital signs reviewed and stable  Post vital signs: Reviewed and stable  Last Vitals:  Vitals Value Taken Time  BP    Temp    Pulse 71 10/05/2018  2:32 PM  Resp 14 10/05/2018  2:32 PM  SpO2 98 % 10/05/2018  2:32 PM  Vitals shown include unvalidated device data.  Last Pain:  Vitals:   10/05/18 1242  TempSrc: Oral  PainSc: 5          Complications: No apparent anesthesia complications

## 2018-10-06 ENCOUNTER — Encounter (HOSPITAL_BASED_OUTPATIENT_CLINIC_OR_DEPARTMENT_OTHER): Payer: Self-pay | Admitting: Orthopedic Surgery

## 2018-10-06 NOTE — Anesthesia Postprocedure Evaluation (Signed)
Anesthesia Post Note  Patient: SHAQUERA ANSLEY  Procedure(s) Performed: RELEASE TRIGGER FINGER/A-1 PULLEY LEFT THUMB (Left Thumb)     Patient location during evaluation: PACU Anesthesia Type: MAC Level of consciousness: awake and alert Pain management: pain level controlled Vital Signs Assessment: post-procedure vital signs reviewed and stable Respiratory status: spontaneous breathing, nonlabored ventilation and respiratory function stable Cardiovascular status: stable and blood pressure returned to baseline Postop Assessment: no apparent nausea or vomiting Anesthetic complications: no    Last Vitals:  Vitals:   10/05/18 1453 10/05/18 1515  BP: 127/84 (!) 151/77  Pulse: (!) 53 60  Resp: 10 18  Temp:  36.5 C  SpO2: 100% 96%    Last Pain:  Vitals:   10/06/18 1222  TempSrc:   PainSc: 1                  Catalina Gravel

## 2019-10-26 ENCOUNTER — Ambulatory Visit: Payer: Medicare Other

## 2019-11-01 ENCOUNTER — Ambulatory Visit (INDEPENDENT_AMBULATORY_CARE_PROVIDER_SITE_OTHER): Payer: Medicare Other | Admitting: Bariatrics

## 2019-11-01 ENCOUNTER — Encounter (INDEPENDENT_AMBULATORY_CARE_PROVIDER_SITE_OTHER): Payer: Self-pay | Admitting: Bariatrics

## 2019-11-01 ENCOUNTER — Other Ambulatory Visit: Payer: Self-pay

## 2019-11-01 VITALS — BP 125/80 | HR 69 | Temp 98.2°F | Ht 67.0 in | Wt 213.0 lb

## 2019-11-01 DIAGNOSIS — E6609 Other obesity due to excess calories: Secondary | ICD-10-CM

## 2019-11-01 DIAGNOSIS — R7309 Other abnormal glucose: Secondary | ICD-10-CM

## 2019-11-01 DIAGNOSIS — Z6833 Body mass index (BMI) 33.0-33.9, adult: Secondary | ICD-10-CM

## 2019-11-01 DIAGNOSIS — E7849 Other hyperlipidemia: Secondary | ICD-10-CM

## 2019-11-01 DIAGNOSIS — Z1331 Encounter for screening for depression: Secondary | ICD-10-CM

## 2019-11-01 DIAGNOSIS — R0602 Shortness of breath: Secondary | ICD-10-CM | POA: Diagnosis not present

## 2019-11-01 DIAGNOSIS — G43809 Other migraine, not intractable, without status migrainosus: Secondary | ICD-10-CM

## 2019-11-01 DIAGNOSIS — J45909 Unspecified asthma, uncomplicated: Secondary | ICD-10-CM | POA: Diagnosis not present

## 2019-11-01 DIAGNOSIS — E669 Obesity, unspecified: Secondary | ICD-10-CM | POA: Diagnosis not present

## 2019-11-01 DIAGNOSIS — R5383 Other fatigue: Secondary | ICD-10-CM | POA: Diagnosis not present

## 2019-11-01 DIAGNOSIS — Z0289 Encounter for other administrative examinations: Secondary | ICD-10-CM

## 2019-11-01 DIAGNOSIS — E559 Vitamin D deficiency, unspecified: Secondary | ICD-10-CM

## 2019-11-01 NOTE — Progress Notes (Signed)
Dear Dr. Gweneth Dimitri,   Thank you for referring Deanna Reese to our clinic. The following note includes my evaluation and treatment recommendations.  Chief Complaint:   OBESITY Deanna Reese (MR# [id number]) is a 69 y.o. female who presents for evaluation and treatment of obesity and related comorbidities. Current BMI is Body mass index is 33.36 kg/m.Deanna Reese Deanna Reese has been struggling with Deanna Reese weight for many years and has been unsuccessful in either losing weight, maintaining weight loss, or reaching Deanna Reese healthy weight goal.  Deanna Reese is currently in the action stage of change and ready to dedicate time achieving and maintaining a healthier weight. Deanna Reese is interested in becoming our patient and working on intensive lifestyle modifications including (but not limited to) diet and exercise for weight loss.  Deanna Reese's habits were reviewed today and are as follows: Deanna Reese desired weight loss is 43 lbs, she started gaining weight after retirement in 2003, Deanna Reese heaviest weight ever was 220 pounds, she craves sweets, she skips breakfast ocasionally, she is trying to follow a vegetarian diet and she struggles with emotional eating.  Depression Screen [name]'s Food and Mood (modified PHQ-9) score was 7.  Depression screen PHQ 2/9 11/01/2019  Decreased Interest 1  Down, Depressed, Hopeless 1  PHQ - 2 Score 2  Altered sleeping 0  Tired, decreased energy 0  Change in appetite 1  Feeling bad or failure about yourself  2  Trouble concentrating 0  Moving slowly or fidgety/restless 2  Suicidal thoughts 0  PHQ-9 Score 7  Difficult doing work/chores Not difficult at all   Subjective:   Other fatigue. Deanna Reese denies daytime somnolence and denies waking up still tired. Patent has a history of symptoms of morning headache sometimes. Deanna Reese generally gets 8 hours of sleep per night, and states that she has generally restful sleep. Snoring is not present. Apneic episodes are not present. Epworth Sleepiness  Score is 4.  Shortness of breath on exertion. Deanna Reese notes increasing shortness of breath with certain activities and seems to be worsening over time with weight gain. She notes getting out of breath sooner with activity than she used to. This has gotten worse recently. Deanna Reese denies shortness of breath at rest or orthopnea.  Uncomplicated asthma, unspecified asthma severity, unspecified whether persistent. Deanna Reese takes Advair. No rescue inhaler.  Other migraine without status migrainosus, not intractable. Deanna Reese takes rizatriptan. She has 1 or 2 migraines every other month.  Other hyperlipidemia. Cholesterol 264 with an LDL of 197.   No results found for: CHOL, HDL, LDLCALC, LDLDIRECT, TRIG, CHOLHDL No results found for: ALT, AST, GGT, ALKPHOS, BILITOT The ASCVD Risk score Denman George DC Jr., et al., 2013) failed to calculate for the following reasons:   Cannot find a previous HDL lab   Cannot find a previous total cholesterol lab  Vitamin D deficiency. Deanna Reese is not on Vitamin D.  Elevated glucose. Deanna Reese has a history of some elevated blood glucose readings without a diagnosis of diabetes. She reports a decreased appetite, but craves sweets.  Depression screening. Deanna Reese had a mildly positive depression screen with a PHQ-9 score of 7.  Assessment/Plan:   Other fatigue. Deanna Reese does feel that Deanna Reese weight is causing Deanna Reese energy to be lower than it should be. Fatigue may be related to obesity, depression or many other causes. Labs will be ordered, and in the meanwhile, Alieyah will focus on self care including making healthy food choices, increasing physical activity and focusing on stress reduction.  EKG 12-Lead  Shortness of  breath on exertion. Deanna Reese does feel that she gets out of breath more easily that she used to when she exercises. Deanna Reese's shortness of breath appears to be obesity related and exercise induced. She has agreed to work on weight loss and gradually increase exercise to treat Deanna Reese  exercise induced shortness of breath. Will continue to monitor closely.  Uncomplicated asthma, unspecified asthma severity, unspecified whether persistent. Deanna Reese will use rescue inhaler if needed.  Other migraine without status migrainosus, not intractable. Deanna Reese will continue rizatriptan as needed.  Other hyperlipidemia. Cardiovascular risk and specific lipid/LDL goals reviewed.  We discussed several lifestyle modifications today and Deanna Reese will continue to work on diet, exercise and weight loss efforts. Orders and follow up as documented in patient record. Deanna Reese will decrease saturated fats and increase good fats (MUFA's and PUFA's).  Counseling Intensive lifestyle modifications are the first line treatment for this issue. . Dietary changes: Increase soluble fiber. Decrease simple carbohydrates. . Exercise changes: Moderate to vigorous-intensity aerobic activity 150 minutes per week if tolerated. . Lipid-lowering medications: see documented in medical record.  Vitamin D deficiency. Low Vitamin D level contributes to fatigue and are associated with obesity, breast, and colon cancer. VITAMIN D 25 Hydroxy (Vit-D Deficiency, Fractures) level ordered.  Elevated glucose. Fasting labs will be obtained and results with be discussed with Deanna Reese in 2 weeks at Deanna Reese follow up visit. In the meanwhile Deanna Reese was started on a lower simple carbohydrate diet and will work on weight loss efforts. Comprehensive metabolic panel, Hemoglobin A1c, Insulin, random labs ordered.  Depression screening. Deanna Reese had a positive depression screening. Depression is commonly associated with obesity and often results in emotional eating behaviors. We will monitor this closely and work on CBT to help improve the non-hunger eating patterns. Referral to Psychology may be required if no improvement is seen as she continues in our clinic.  Class 1 obesity with serious comorbidity and body mass index (BMI) of 33.0 to 33.9 in adult,  unspecified obesity type.  Deanna Reese is currently in the action stage of change and Deanna Reese goal is to continue with weight loss efforts. I recommend [name] begin the structured treatment plan as follows:  She has agreed to the Category 4 Plan with Pescatarian focus..  She will work on meal planning and mindful eating.  We reviewed labs with the patient via phone including lipids, TSH, and insulin.  Exercise goals: Older adults should follow the adult guidelines. When older adults cannot meet the adult guidelines, they should be as physically active as their abilities and conditions will allow.    Behavioral modification strategies: increasing lean protein intake, decreasing simple carbohydrates, increasing vegetables, increasing water intake, decreasing eating out, no skipping meals, meal planning and cooking strategies, keeping healthy foods in the home and planning for success.  She was informed of the importance of frequent follow-up visits to maximize Deanna Reese success with intensive lifestyle modifications for Deanna Reese multiple health conditions. She was informed we would discuss Deanna Reese lab results at Deanna Reese next visit unless there is a critical issue that needs to be addressed sooner. Aveen agreed to keep Deanna Reese next visit at the agreed upon time to discuss these results.  Objective:   Blood pressure 125/80, pulse 69, temperature 98.2 F (36.8 C), height 5\' 7"  (1.702 m), weight 213 lb (96.6 kg), SpO2 95 %. Body mass index is 33.36 kg/m.  EKG: Sinus  Rhythm with a rate of 69 BPM. Poor R wave progression may be secondary to body habitus. Otherwise normal.  Indirect Calorimeter completed today shows a VO2 of 369 and a REE of 2565.  Deanna Reese calculated basal metabolic rate is 8299 thus Deanna Reese basal metabolic rate is better than expected.  General: Cooperative, alert, well developed, in no acute distress. HEENT: Conjunctivae and lids unremarkable. Cardiovascular: Regular rhythm.  Lungs: Normal work of  breathing. Neurologic: No focal deficits.   No results found for: CREATININE, BUN, NA, K, CL, CO2 No results found for: ALT, AST, GGT, ALKPHOS, BILITOT No results found for: HGBA1C No results found for: INSULIN No results found for: TSH No results found for: CHOL, HDL, LDLCALC, LDLDIRECT, TRIG, CHOLHDL No results found for: WBC, HGB, HCT, MCV, PLT No results found for: IRON, TIBC, FERRITIN  Obesity Behavioral Intervention Visit Documentation for Insurance:   Approximately 15 minutes were spent on the discussion below.  ASK: We discussed the diagnosis of obesity with Rynn today and Verania agreed to give [name] permission to discuss obesity behavioral modification therapy today.  ASSESS: Chaye has the diagnosis of obesity and Deanna Reese BMI today is 33.4. Sheneka is in the action stage of change.   ADVISE: Meg was educated on the multiple health risks of obesity as well as the benefit of weight loss to improve Deanna Reese health. She was advised of the need for long term treatment and the importance of lifestyle modifications to improve Deanna Reese current health and to decrease Deanna Reese risk of future health problems.  AGREE: Multiple dietary modification options and treatment options were discussed and Sharmel agreed to follow the recommendations documented in the above note.  ARRANGE: Julyssa was educated on the importance of frequent visits to treat obesity as outlined per CMS and USPSTF guidelines and agreed to schedule Deanna Reese next follow up appointment today.  Attestation Statements:   Reviewed by clinician on day of visit: allergies, medications, problem list, medical history, surgical history, family history, social history, and previous encounter notes.  Migdalia Dk, am acting as Location manager for [employer], DO   I have reviewed the above documentation for accuracy and completeness, and I agree with the above. Jearld Lesch, DO

## 2019-11-02 ENCOUNTER — Encounter (INDEPENDENT_AMBULATORY_CARE_PROVIDER_SITE_OTHER): Payer: Self-pay | Admitting: Bariatrics

## 2019-11-02 LAB — HEMOGLOBIN A1C
Est. average glucose Bld gHb Est-mCnc: 100 mg/dL
Hgb A1c MFr Bld: 5.1 % (ref 4.8–5.6)

## 2019-11-02 LAB — COMPREHENSIVE METABOLIC PANEL
ALT: 15 IU/L (ref 0–32)
AST: 17 IU/L (ref 0–40)
Albumin/Globulin Ratio: 1.6 (ref 1.2–2.2)
Albumin: 4.1 g/dL (ref 3.8–4.8)
Alkaline Phosphatase: 93 IU/L (ref 39–117)
BUN/Creatinine Ratio: 15 (ref 12–28)
BUN: 12 mg/dL (ref 8–27)
Bilirubin Total: 0.2 mg/dL (ref 0.0–1.2)
CO2: 21 mmol/L (ref 20–29)
Calcium: 9.3 mg/dL (ref 8.7–10.3)
Chloride: 104 mmol/L (ref 96–106)
Creatinine, Ser: 0.79 mg/dL (ref 0.57–1.00)
GFR calc Af Amer: 90 mL/min/{1.73_m2} (ref >59)
GFR calc non Af Amer: 78 mL/min/{1.73_m2} (ref >59)
Globulin, Total: 2.5 g/dL (ref 1.5–4.5)
Glucose: 88 mg/dL (ref 65–99)
Potassium: 4.1 mmol/L (ref 3.5–5.2)
Sodium: 141 mmol/L (ref 134–144)
Total Protein: 6.6 g/dL (ref 6.0–8.5)

## 2019-11-02 LAB — INSULIN, RANDOM: INSULIN: 7 u[IU]/mL (ref 2.6–24.9)

## 2019-11-02 LAB — VITAMIN D 25 HYDROXY (VIT D DEFICIENCY, FRACTURES): Vit D, 25-Hydroxy: 31.9 ng/mL (ref 30.0–100.0)

## 2019-11-02 NOTE — Telephone Encounter (Signed)
Please review

## 2019-11-15 ENCOUNTER — Ambulatory Visit (INDEPENDENT_AMBULATORY_CARE_PROVIDER_SITE_OTHER): Payer: Medicare Other | Admitting: Bariatrics

## 2019-11-15 ENCOUNTER — Other Ambulatory Visit: Payer: Self-pay

## 2019-11-15 ENCOUNTER — Encounter (INDEPENDENT_AMBULATORY_CARE_PROVIDER_SITE_OTHER): Payer: Self-pay | Admitting: Bariatrics

## 2019-11-15 VITALS — BP 132/78 | HR 60 | Temp 98.0°F | Ht 67.0 in | Wt 213.0 lb

## 2019-11-15 DIAGNOSIS — E669 Obesity, unspecified: Secondary | ICD-10-CM | POA: Diagnosis not present

## 2019-11-15 DIAGNOSIS — Z6833 Body mass index (BMI) 33.0-33.9, adult: Secondary | ICD-10-CM | POA: Diagnosis not present

## 2019-11-15 DIAGNOSIS — G43809 Other migraine, not intractable, without status migrainosus: Secondary | ICD-10-CM

## 2019-11-15 DIAGNOSIS — E559 Vitamin D deficiency, unspecified: Secondary | ICD-10-CM

## 2019-11-15 MED ORDER — VITAMIN D (ERGOCALCIFEROL) 1.25 MG (50000 UNIT) PO CAPS: 50000.0000 [IU] | ORAL_CAPSULE | ORAL | 0 refills | Status: AC

## 2019-11-15 NOTE — Progress Notes (Signed)
Chief Complaint:   OBESITY Deanna Reese is here to discuss her progress with her obesity treatment plan along with follow-up of her obesity related diagnoses. Deanna Reese is on the Category 4 Plan and states she is following her eating plan approximately 40% of the time. Deanna Reese states she is walking 60 minutes 4-5 times per week.  Today's visit was #: 2 Starting weight: 213 lbs Starting date: 11/01/2019 Today's weight: 213 lbs Today's date: 11/15/2019 Total lbs lost to date: 0 Total lbs lost since last in-office visit: 0  Interim History: Deanna Reese's weight remains the same. She could not eat all the meat or food. She reports getting adequate water.   Subjective:   Vitamin D deficiency. Deanna Reese is taking Vitamin OTC. Last Vitamin D 31.9 on 11/01/2019.  Other migraine without status migrainosus, not intractable. Deanna Reese is taking Maxalt. She reports 1-2 migraines every 1-2 months.  Assessment/Plan:   Vitamin D deficiency. Low Vitamin D level contributes to fatigue and are associated with obesity, breast, and colon cancer. She was given a prescription for Vitamin D, Ergocalciferol, (DRISDOL) 1.25 MG (50000 UNIT) CAPS capsule every week #4 with 0 refills and will follow-up for routine testing of Vitamin D, at least 2-3 times per year to avoid over-replacement.    Other migraine without status migrainosus, not intractable. Deanna Reese will continue Maxalt as directed. Headaches may improve with weight loss.  Class 1 obesity with serious comorbidity and body mass index (BMI) of 33.0 to 33.9 in adult, unspecified obesity type.  Deanna Reese is currently in the action stage of change. As such, her goal is to continue with weight loss efforts. She has agreed to the Category 3 Plan with Pescatarian focus.  She will work on meal planning.  We independently reviewed with the patient labs from 11/01/2019 including CMP, Vitamin D, A1c, and insulin.   Exercise goals: Deanna Reese will continue walking 60 minutes  4-5 times per week.  Behavioral modification strategies: increasing lean protein intake, decreasing simple carbohydrates, increasing vegetables, increasing water intake, decreasing eating out, no skipping meals, meal planning and cooking strategies, keeping healthy foods in the home, ways to avoid boredom eating, better snacking choices, emotional eating strategies and planning for success.  Deanna Reese has agreed to follow-up with our clinic in 2 weeks. She was informed of the importance of frequent follow-up visits to maximize her success with intensive lifestyle modifications for her multiple health conditions.   Objective:   Blood pressure 132/78, pulse 60, temperature 98 F (36.7 C), height 5\' 7"  (1.702 m), weight 213 lb (96.6 kg), SpO2 97 %. Body mass index is 33.36 kg/m.  General: Cooperative, alert, well developed, in no acute distress. HEENT: Conjunctivae and lids unremarkable. Cardiovascular: Regular rhythm.  Lungs: Normal work of breathing. Neurologic: No focal deficits.   Lab Results  Component Value Date   CREATININE 0.79 11/01/2019   BUN 12 11/01/2019   NA 141 11/01/2019   K 4.1 11/01/2019   CL 104 11/01/2019   CO2 21 11/01/2019   Lab Results  Component Value Date   ALT 15 11/01/2019   AST 17 11/01/2019   ALKPHOS 93 11/01/2019   BILITOT 0.2 11/01/2019   Lab Results  Component Value Date   HGBA1C 5.1 11/01/2019   Lab Results  Component Value Date   INSULIN 7.0 11/01/2019   No results found for: TSH No results found for: CHOL, HDL, LDLCALC, LDLDIRECT, TRIG, CHOLHDL No results found for: WBC, HGB, HCT, MCV, PLT No results found for: IRON,  TIBC, FERRITIN  Obesity Behavioral Intervention Documentation for Insurance:   Approximately 15 minutes were spent on the discussion below.  ASK: We discussed the diagnosis of obesity with Deanna Reese today and Deanna Reese agreed to give [name] permission to discuss obesity behavioral modification therapy today.  ASSESS: Deanna Reese has  the diagnosis of obesity and her BMI today is 33.5. Deanna Reese is in the action stage of change.   ADVISE: Deanna Reese was educated on the multiple health risks of obesity as well as the benefit of weight loss to improve her health. She was advised of the need for long term treatment and the importance of lifestyle modifications to improve her current health and to decrease her risk of future health problems.  AGREE: Multiple dietary modification options and treatment options were discussed and Deanna Reese agreed to follow the recommendations documented in the above note.  ARRANGE: Deanna Reese was educated on the importance of frequent visits to treat obesity as outlined per CMS and USPSTF guidelines and agreed to schedule her next follow up appointment today.  Attestation Statements:   Reviewed by clinician on day of visit: allergies, medications, problem list, medical history, surgical history, family history, social history, and previous encounter notes.  Migdalia Dk, am acting as Location manager for [employer], DO   I have reviewed the above documentation for accuracy and completeness, and I agree with the above. Jearld Lesch, DO

## 2019-11-30 ENCOUNTER — Other Ambulatory Visit: Payer: Self-pay

## 2019-11-30 ENCOUNTER — Encounter (INDEPENDENT_AMBULATORY_CARE_PROVIDER_SITE_OTHER): Payer: Self-pay | Admitting: Bariatrics

## 2019-11-30 ENCOUNTER — Ambulatory Visit (INDEPENDENT_AMBULATORY_CARE_PROVIDER_SITE_OTHER): Payer: Medicare Other | Admitting: Bariatrics

## 2019-11-30 VITALS — BP 107/71 | HR 70 | Temp 98.0°F | Ht 67.0 in | Wt 212.0 lb

## 2019-11-30 DIAGNOSIS — G43809 Other migraine, not intractable, without status migrainosus: Secondary | ICD-10-CM

## 2019-11-30 DIAGNOSIS — E559 Vitamin D deficiency, unspecified: Secondary | ICD-10-CM

## 2019-11-30 DIAGNOSIS — Z6833 Body mass index (BMI) 33.0-33.9, adult: Secondary | ICD-10-CM | POA: Diagnosis not present

## 2019-11-30 DIAGNOSIS — E669 Obesity, unspecified: Secondary | ICD-10-CM | POA: Diagnosis not present

## 2019-11-30 NOTE — Progress Notes (Signed)
Chief Complaint:   OBESITY Deanna Reese is here to discuss her progress with her obesity treatment plan along with follow-up of her obesity related diagnoses. Deanna Reese is on the Category 3 Plan and states she is following her eating plan approximately 85% of the time. [name] states she is walking 45-60 minutes 3-5 times per week.  Today's visit was #: 3 Starting weight: 213 lbs Starting date: 11/01/2019 Today's weight: 212 lbs Today's date: 11/30/2019 Total lbs lost to date: 1 Total lbs lost since last in-office visit: 1  Interim History: Deanna Reese is down 1 lb.  Subjective:   Vitamin D deficiency. Deanna Reese is taking Vitamin D. Last Vitamin D 31.9 on 11/01/2019.  Other migraine without status migrainosus, not intractable. Deanna Reese is taking Maxalt.  Assessment/Plan:   Vitamin D deficiency. Low Vitamin D level contributes to fatigue and are associated with obesity, breast, and colon cancer. She agrees to continue to take Vitamin D and will follow-up for routine testing of Vitamin D, at least 2-3 times per year to avoid over-replacement.  Other migraine without status migrainosus, not intractable. Deanna Reese will follow-up with her PCP.  Class 1 obesity with serious comorbidity and body mass index (BMI) of 33.0 to 33.9 in adult, unspecified obesity type.  [name] is currently in the action stage of change. As such, her goal is to continue with weight loss efforts. She has agreed to the Category 3 Plan with Pescatarian options.   She will work on meal planning. Handouts were given on Recipes for increased protein (soups and casseroles).  Exercise goals: Deanna Reese will continue walking.  Behavioral modification strategies: increasing lean protein intake, decreasing simple carbohydrates, increasing vegetables, increasing water intake, decreasing eating out, no skipping meals, meal planning and cooking strategies and keeping healthy foods in the home.  Deanna Reese has agreed to follow-up with our  clinic in 2 weeks. She was informed of the importance of frequent follow-up visits to maximize her success with intensive lifestyle modifications for her multiple health conditions.   Objective:   Blood pressure 107/71, pulse 70, temperature 98 F (36.7 C), height 5\' 7"  (1.702 m), weight 212 lb (96.2 kg), SpO2 96 %. Body mass index is 33.2 kg/m.  General: Cooperative, alert, well developed, in no acute distress. HEENT: Conjunctivae and lids unremarkable. Cardiovascular: Regular rhythm.  Lungs: Normal work of breathing. Neurologic: No focal deficits.   Lab Results  Component Value Date   CREATININE 0.79 11/01/2019   BUN 12 11/01/2019   NA 141 11/01/2019   K 4.1 11/01/2019   CL 104 11/01/2019   CO2 21 11/01/2019   Lab Results  Component Value Date   ALT 15 11/01/2019   AST 17 11/01/2019   ALKPHOS 93 11/01/2019   BILITOT 0.2 11/01/2019   Lab Results  Component Value Date   HGBA1C 5.1 11/01/2019   Lab Results  Component Value Date   INSULIN 7.0 11/01/2019   No results found for: TSH No results found for: CHOL, HDL, LDLCALC, LDLDIRECT, TRIG, CHOLHDL No results found for: WBC, HGB, HCT, MCV, PLT No results found for: IRON, TIBC, FERRITIN  Obesity Behavioral Intervention Documentation for Insurance:   Approximately 15 minutes were spent on the discussion below.  ASK: We discussed the diagnosis of obesity with Deanna Reese today and Berania agreed to give [name] permission to discuss obesity behavioral modification therapy today.  ASSESS: Deanna Reese has the diagnosis of obesity and her BMI today is 33.3. Deanna Reese is in the action stage of change.   ADVISE:  Deanna Reese was educated on the multiple health risks of obesity as well as the benefit of weight loss to improve her health. She was advised of the need for long term treatment and the importance of lifestyle modifications to improve her current health and to decrease her risk of future health problems.  AGREE: Multiple dietary  modification options and treatment options were discussed and Deanna Reese agreed to follow the recommendations documented in the above note.  ARRANGE: Deanna Reese was educated on the importance of frequent visits to treat obesity as outlined per CMS and USPSTF guidelines and agreed to schedule her next follow up appointment today.  Attestation Statements:   Reviewed by clinician on day of visit: allergies, medications, problem list, medical history, surgical history, family history, social history, and previous encounter notes.  Deanna Reese, am acting as Energy manager for [employer], DO   I have reviewed the above documentation for accuracy and completeness, and I agree with the above. Deanna Capra, DO

## 2019-12-14 ENCOUNTER — Ambulatory Visit (INDEPENDENT_AMBULATORY_CARE_PROVIDER_SITE_OTHER): Payer: Medicare Other | Admitting: Bariatrics

## 2020-08-11 ENCOUNTER — Other Ambulatory Visit: Payer: Medicare Other

## 2021-09-05 ENCOUNTER — Other Ambulatory Visit: Payer: Self-pay

## 2022-04-10 ENCOUNTER — Encounter (INDEPENDENT_AMBULATORY_CARE_PROVIDER_SITE_OTHER): Payer: Self-pay

## 2023-09-08 DIAGNOSIS — H25813 Combined forms of age-related cataract, bilateral: Secondary | ICD-10-CM | POA: Diagnosis not present

## 2023-09-08 DIAGNOSIS — H401231 Low-tension glaucoma, bilateral, mild stage: Secondary | ICD-10-CM | POA: Diagnosis not present

## 2023-09-08 DIAGNOSIS — H04123 Dry eye syndrome of bilateral lacrimal glands: Secondary | ICD-10-CM | POA: Diagnosis not present

## 2023-09-08 DIAGNOSIS — H524 Presbyopia: Secondary | ICD-10-CM | POA: Diagnosis not present

## 2023-09-08 DIAGNOSIS — H40033 Anatomical narrow angle, bilateral: Secondary | ICD-10-CM | POA: Diagnosis not present

## 2024-09-06 ENCOUNTER — Emergency Department (HOSPITAL_COMMUNITY)

## 2024-09-06 ENCOUNTER — Other Ambulatory Visit: Payer: Self-pay

## 2024-09-06 ENCOUNTER — Emergency Department (HOSPITAL_COMMUNITY)
Admission: EM | Admit: 2024-09-06 | Discharge: 2024-09-06 | Disposition: A | Discharge status: Home / Self Care | Attending: Emergency Medicine | Admitting: Emergency Medicine

## 2024-09-06 ENCOUNTER — Encounter (HOSPITAL_COMMUNITY): Payer: Self-pay | Admitting: *Deleted

## 2024-09-06 DIAGNOSIS — R911 Solitary pulmonary nodule: Secondary | ICD-10-CM | POA: Diagnosis not present

## 2024-09-06 DIAGNOSIS — R55 Syncope and collapse: Secondary | ICD-10-CM | POA: Diagnosis present

## 2024-09-06 LAB — BASIC METABOLIC PANEL WITH GFR
Anion gap: 8 (ref 5–15)
BUN: 11 mg/dL (ref 8–23)
CO2: 27 mmol/L (ref 22–32)
Calcium: 9.1 mg/dL (ref 8.9–10.3)
Chloride: 104 mmol/L (ref 98–111)
Creatinine, Ser: 0.74 mg/dL (ref 0.44–1.00)
GFR, Estimated: >60 mL/min
Glucose, Bld: 95 mg/dL (ref 70–99)
Potassium: 3.8 mmol/L (ref 3.5–5.1)
Sodium: 139 mmol/L (ref 135–145)

## 2024-09-06 LAB — CBC
HCT: 45.6 % (ref 36.0–46.0)
Hemoglobin: 15.8 g/dL — ABNORMAL HIGH (ref 12.0–15.0)
MCH: 33.3 pg (ref 26.0–34.0)
MCHC: 34.6 g/dL (ref 30.0–36.0)
MCV: 96 fL (ref 80.0–100.0)
Platelets: 246 K/uL (ref 150–400)
RBC: 4.75 MIL/uL (ref 3.87–5.11)
RDW: 12.3 % (ref 11.5–15.5)
WBC: 8.2 K/uL (ref 4.0–10.5)
nRBC: 0 % (ref 0.0–0.2)

## 2024-09-06 LAB — TROPONIN T, HIGH SENSITIVITY: Troponin T High Sensitivity: <15 ng/L (ref 0–19)

## 2024-09-06 NOTE — ED Provider Notes (Signed)
 " [facility] Provider Note   CSN: [id number] Arrival date & time: 09/06/24  9368     Patient presents with: Chest Pain and Near Syncope   Deanna Reese is a 74 y.o. female.   HPI 74 year old female presents for the evaluation of near syncope.  She has been having near syncope since July.  While she is up doing activities such as working around the house she has intermittently but not in any specific other pattern noticed transient lightheadedness.  She has passed out from this before.  However more recently she notices the symptoms of lightheadedness and sits/lays down and then it goes away.  Usually lasts a few minutes.  Does not have any chest pain, shortness of breath, or headaches while this is occurring.  She has occasional palpitations at night where she will feel some skipped beats but nothing during the symptoms.  She went to the walk-in clinic on 1/2 after an episode, and based on her having near syncope they advised her to go to the ER.  She decided to wait until today, but no new symptoms.  However based on my review of the notes sent in with the patient, she had negative orthostatics and an unremarkable EKG.  Prior to Admission medications  Medication Sig Start Date End Date Taking? Authorizing Provider  Calcium Lactate 500 MG CAPS Take 600 mg by mouth.    [provider]  cholecalciferol (VITAMIN D3) 25 MCG (1000 UT) tablet Take 1,000 Units by mouth daily.    [provider]  Fluticasone-Salmeterol (ADVAIR) 250-50 MCG/DOSE AEPB Inhale 1 puff into the lungs 2 (two) times daily.    [provider]  glucosamine-chondroitin 500-400 MG tablet Take 1 tablet by mouth 2 (two) times daily. 1000 mg qd    [provider]  milk thistle 175 MG tablet Take 175 mg by mouth daily. 400 IU qd    [provider]  omega-3 acid ethyl esters (LOVAZA) 1 g capsule Take by mouth 2 (two) times daily.    [provider]  rizatriptan (MAXALT-MLT) 10 MG disintegrating tablet Take 10 mg by mouth as needed for migraine. May repeat in 2 hours if needed    [provider]  vitamin B-12 (CYANOCOBALAMIN) 100 MCG tablet Take 100 mcg by mouth daily.    [provider]  vitamin C (ASCORBIC ACID) 500 MG tablet Take 500 mg by mouth daily.    [provider]  Vitamin D , Ergocalciferol , (DRISDOL ) 1.25 MG (50000 UNIT) CAPS capsule Take 1 capsule (50,000 Units total) by mouth every 7 (seven) days. 11/15/19   Delores Shields A, DO  vitamin E 400 UNIT capsule Take 400 Units by mouth daily.    [provider]    Allergies: Patient has no known allergies.    Review of Systems  Respiratory:  Negative for shortness of breath.   Cardiovascular:  Negative for chest pain.  Neurological:  Positive for light-headedness.    Updated Vital Signs BP (!) 145/84 (BP Location: Right Arm)   Pulse 74   Temp 97.8 F (36.6 C)   Resp 18   Ht 5' 8 (1.727 m)   Wt 88.5 kg   SpO2 95%   BMI 29.65 kg/m   Physical Exam Vitals and nursing note reviewed.  Constitutional:      General: She is not in acute distress.    Appearance: She is well-developed. She is not ill-appearing or diaphoretic.  HENT:  Head: Normocephalic and atraumatic.  Cardiovascular:     Rate and Rhythm: Normal rate and regular rhythm.     Heart sounds: Normal heart sounds. No murmur heard. Pulmonary:     Effort: Pulmonary effort is normal.     Breath sounds: Normal breath sounds.  Abdominal:     Palpations: Abdomen is soft.     Tenderness: There is no abdominal tenderness.  Skin:    General: Skin is warm and dry.  Neurological:     Mental Status: She is alert.     Comments: CN 3-12 grossly intact. 5/5 strength in all 4 extremities. Grossly normal sensation. Normal finger to nose.      (all labs ordered are listed, but only abnormal results are displayed) Labs Reviewed  CBC - Abnormal; Notable for the  following components:      Result Value   Hemoglobin 15.8 (*)    All other components within normal limits  BASIC METABOLIC PANEL WITH GFR  TROPONIN T, HIGH SENSITIVITY    EKG: EKG Interpretation Date/Time:  Monday September 06 2024 06:46:10 EST Ventricular Rate:  95 PR Interval:  156 QRS Duration:  76 QT Interval:  354 QTC Calculation: 444 R Axis:   2  Text Interpretation: Normal sinus rhythm Inferior infarct , age undetermined Anterolateral infarct , age undetermined Abnormal ECG No previous ECGs available Confirmed by Freddi Hamilton [phone]) on 09/06/2024 8:40:08 AM  Radiology: [facility] Chest 2 View Result Date: 09/06/2024 CLINICAL DATA:  Near syncope. EXAM: CHEST - 2 VIEW COMPARISON:  None Available. FINDINGS: The lungs are clear without focal pneumonia, edema, pneumothorax or pleural effusion. Subtle nodular density projects over the left upper lobe. The cardiopericardial silhouette is within normal limits for size. No acute bony abnormality. IMPRESSION: Subtle nodular density over the left upper lobe. CT chest without contrast recommended to further evaluate. Otherwise no acute cardiopulmonary findings. Electronically Signed   By: Camellia Candle M.D.   On: 09/06/2024 07:02     Procedures   Medications Ordered in the ED - No data to display                                  Medical Decision Making Amount and/or Complexity of Data Reviewed External Data Reviewed: notes. Labs: ordered.    Details: Normal creatinine.  Normal WBC and troponin Radiology: ordered and independent interpretation performed.    Details: No CHF ECG/medicine tests: ordered and independent interpretation performed.    Details: Sinus rhythm without acute ischemia   Patient presents with near syncope, most recently 3 days ago but has been on and off for several months.  No red flags today.  From the note from her PCPs office there was no red flags there besides the fact that she keeps having the symptoms.   Cardiac monitoring does not show any significant findings on my review.  This was brief and I discussed we can consider outpatient monitoring with a Zio patch.  However I do not think she would necessarily benefit from admission given her symptoms are so random.  She also has not passed out in quite some time.  No chest pain, thunderclap headache, etc.  I think she is stable for an outpatient urgent cardiology follow-up and follow-up with PCP.  Given return precautions.     Final diagnoses:  Near syncope  Lung nodule    ED Discharge Orders  Ordered    Ambulatory referral to Cardiology       Comments: If you have not heard from the Cardiology office within the next 72 hours please call [phone].   09/06/24 9082               Freddi Hamilton, MD 09/06/24 1134  "

## 2024-09-06 NOTE — Discharge Instructions (Addendum)
 We are referring you to cardiology for your lightheadedness and nearly passing out.  You also need to follow-up with your primary care provider for a spot in your lungs called a lung nodule that will need an outpatient CT scan for further evaluation.   If you develop any new or worsening symptoms then return to the ER.

## 2024-09-06 NOTE — ED Triage Notes (Signed)
 Patient states she has been have syncopal episodes since July was seen at [facility] clinic on Friday and was told after her EKG , that she needed to come to come to the hospital and be admitted, states she signed out AMA because she had things to do this weekend , so she came in the am no chest pain coming because doctor wanted her to fri.

## 2024-09-07 DIAGNOSIS — R918 Other nonspecific abnormal finding of lung field: Secondary | ICD-10-CM

## 2024-09-14 ENCOUNTER — Encounter (HOSPITAL_COMMUNITY): Payer: Self-pay

## 2024-09-14 ENCOUNTER — Ambulatory Visit (HOSPITAL_COMMUNITY)
Admission: RE | Admit: 2024-09-14 | Discharge: 2024-09-14 | Disposition: A | Discharge status: Home / Self Care | Source: Ambulatory Visit | Attending: Family Medicine | Admitting: Family Medicine

## 2024-09-14 DIAGNOSIS — R918 Other nonspecific abnormal finding of lung field: Secondary | ICD-10-CM | POA: Insufficient documentation

## 2024-09-16 ENCOUNTER — Ambulatory Visit: Attending: Physician Assistant | Admitting: Physician Assistant

## 2024-09-16 VITALS — BP 138/86 | HR 49 | Ht 67.0 in | Wt 202.1 lb

## 2024-09-16 DIAGNOSIS — R55 Syncope and collapse: Secondary | ICD-10-CM

## 2024-09-16 NOTE — Patient Instructions (Addendum)
 Medication Instructions:  Your physician recommends that you continue on your current medications as directed. Please refer to the Current Medication list given to you today.  *If you need a refill on your cardiac medications before your next appointment, please call your pharmacy*  Lab Work: None ordered  If you have labs (blood work) drawn today and your tests are completely normal, you will receive your results only by: MyChart Message (if you have MyChart) OR A paper copy in the mail If you have any lab test that is abnormal or we need to change your treatment, we will call you to review the results.  Testing/Procedures: Your physician has requested that you have an echocardiogram.  TOMORROW, 09/17/24, ARRIVE AT 10:15 [address] [place], [place] [address].  REGISTER ON THE 1ST FLOOR.  Echocardiography is a painless test that uses sound waves to create images of your heart. It provides your doctor with information about the size and shape of your heart and how well your hearts chambers and valves are working. This procedure takes approximately one hour. There are no restrictions for this procedure. Please do NOT wear cologne, perfume, aftershave, or lotions (deodorant is allowed). Please arrive 15 minutes prior to your appointment time.  Please note: We ask at that you not bring children with you during ultrasound (echo/ vascular) testing. Due to room size and safety concerns, children are not allowed in the ultrasound rooms during exams. Our front office staff cannot provide observation of children in our lobby area while testing is being conducted. An adult accompanying a patient to their appointment will only be allowed in the ultrasound room at the discretion of the ultrasound technician under special circumstances. We apologize for any inconvenience.   Follow-Up: At [facility], you and your health needs are our priority.  As part of our continuing mission to provide you  with exceptional heart care, our providers are all part of one team.  This team includes your primary Cardiologist (physician) and Advanced Practice Providers or APPs (Physician Assistants and Nurse Practitioners) who all work together to provide you with the care you need, when you need it.  Your next appointment:   In February   Provider:   Scot Ford, PA-C          We recommend signing up for the patient portal called MyChart.  Sign up information is provided on this After Visit Summary.  MyChart is used to connect with patients for Virtual Visits (Telemedicine).  Patients are able to view lab/test results, encounter notes, upcoming appointments, etc.  Non-urgent messages can be sent to your provider as well.   To learn more about what you can do with MyChart, go to [email or web address].   Other Instructions

## 2024-09-16 NOTE — Progress Notes (Signed)
 " Cardiology Office Note   Date:  09/16/2024  ID:  Deanna Reese, DOB 06/05/1951, MRN [id number] PCP: Aisha Harvey, MD  [facility] Provider Cardiologist:  [facility]    History of Present Illness Deanna Reese is a 74 y.o. female with past medical history of hyperlipidemia, vitamin D  deficiency, mild persistent asthma and osteoarthritis.  Based on referral note from PCPs office, it appears patient started complaining of dizziness in July.  She described flushing sensation and claimed to have a history of bradycardia.  She described the symptom as recurrent since July.  So far she had 3 episodes of near passing out spell.  There has been no reported seizure-like activity.  Orthostatic vital sign was negative in PCPs office. Patient was recently sent to the emergency room on 09/06/2024 for chest pain and near syncope.  CBC and basic metabolic panel were normal.  Troponin negative x 2.  Chest x-ray showed subtle nodular density over the left upper lobe, CT of the chest recommended to further evaluate.  Patient was recommended to proceed with outpatient evaluation with cardiology service.  She presents today for evaluation of near syncope.  She had a true syncope in July 2025, this did not occur with body position changes.  She was walking around when she had a syncope.  She had a episode of near syncope in August which lasted 1 to 2 minutes.  Symptom eased off for several months however recurred again recently.  She denies any chest pain.  EKG showed questionable Q wave in the inferior lead.  She reportedly had a prior history of bradycardia, heart rate was 49 today.  Her PCP has already placed a heart monitor on her on 09/14/2024.  I recommended a echocardiogram as well to make sure her wall motion is normal and so is her EF.  She is flying back to [place] on 10/11/2024, she usually stay half a year in the [place] and 1/2-year in [place] .  If echocardiogram and the heart monitor  are normal, I would not recommend any further workup.  As of right now, I doubt her heart monitor will come back on time.  If possible, I would prefer her to delay her flight by 2 weeks, however if she could not delay her flight, then I would recommend she turn in her heart monitor early on 1/23 instead of 1/27 to guarantee the heart monitor report come back prior to 2/9.  ROS:   Patient had a syncope in July 2025 and to have more episode of near syncope since then.  She denies any chest pain, shortness of breath, lower extremity edema, orthopnea or PND.  Studies Reviewed      Cardiac Studies & Procedures   ______________________________________________________________________________________________     ECHOCARDIOGRAM  ECHOCARDIOGRAM COMPLETE 09/17/2024  Narrative ECHOCARDIOGRAM REPORT    Patient Name:   Deanna Reese Date of Exam: 09/17/2024 Medical Rec #:  [id number]       Height:       67.0 in Accession #:    [id number]      Weight:       202.1 lb Date of Birth:  [birth year]      BSA:          2.031 m Patient Age:    73 years        BP:           155/96 mmHg Patient Gender: F  HR:           57 bpm. Exam Location:  [address]  Procedure: 2D Echo, Cardiac Doppler, Color Doppler and Intracardiac Opacification Agent (Both Spectral and Color Flow Doppler were utilized during procedure).  Indications:    R07.9 Chest pain  History:        Patient has no prior history of Echocardiogram examinations. Signs/Symptoms:Chest Pain and Near syncope; Risk Factors:Dyslipidemia.  Sonographer:    Nolon Berg BA, RDCS Referring Phys: [phone] Aaliayah Miao  IMPRESSIONS   1. Left ventricular ejection fraction, by estimation, is 60 to 65%. The left ventricle has normal function. The left ventricle has no regional wall motion abnormalities. Left ventricular diastolic parameters are consistent with Grade II diastolic dysfunction (pseudonormalization). 2. Right ventricular  systolic function is normal. The right ventricular size is mildly enlarged. 3. Left atrial size was mildly dilated. 4. The mitral valve is normal in structure. Mild mitral valve regurgitation. No evidence of mitral stenosis. 5. The aortic valve is normal in structure. Aortic valve regurgitation is not visualized. No aortic stenosis is present. 6. The inferior vena cava is normal in size with greater than 50% respiratory variability, suggesting right atrial pressure of 3 mmHg.  Comparison(s): No prior Echocardiogram.  FINDINGS Left Ventricle: Left ventricular ejection fraction, by estimation, is 60 to 65%. The left ventricle has normal function. The left ventricle has no regional wall motion abnormalities. The left ventricular internal cavity size was normal in size. There is no left ventricular hypertrophy. Left ventricular diastolic parameters are consistent with Grade II diastolic dysfunction (pseudonormalization).  Right Ventricle: The right ventricular size is mildly enlarged. No increase in right ventricular wall thickness. Right ventricular systolic function is normal.  Left Atrium: Left atrial size was mildly dilated.  Right Atrium: Right atrial size was normal in size.  Pericardium: There is no evidence of pericardial effusion.  Mitral Valve: The mitral valve is normal in structure. Mild mitral valve regurgitation. No evidence of mitral valve stenosis.  Tricuspid Valve: The tricuspid valve is normal in structure. Tricuspid valve regurgitation is not demonstrated. No evidence of tricuspid stenosis.  Aortic Valve: The aortic valve is normal in structure. Aortic valve regurgitation is not visualized. No aortic stenosis is present.  Pulmonic Valve: The pulmonic valve was normal in structure. Pulmonic valve regurgitation is not visualized. No evidence of pulmonic stenosis.  Aorta: The aortic root is normal in size and structure.  Venous: The inferior vena cava is normal in size with  greater than 50% respiratory variability, suggesting right atrial pressure of 3 mmHg.  IAS/Shunts: No atrial level shunt detected by color flow Doppler.   LEFT VENTRICLE PLAX 2D LVIDd:         5.00 cm   Diastology LVIDs:         2.90 cm   LV e' medial:    5.54 cm/s LV PW:         0.90 cm   LV E/e' medial:  18.2 LV IVS:        0.90 cm   LV e' lateral:   8.87 cm/s LVOT diam:     2.20 cm   LV E/e' lateral: 11.4 LV SV:         102 LV SV Index:   50 LVOT Area:     3.80 cm LV IVRT:       77 msec   RIGHT VENTRICLE             IVC RV Basal diam:  4.10 cm     IVC diam: 2.00 cm RV Mid diam:    3.40 cm RV S prime:     16.95 cm/s  PULMONARY VEINS TAPSE (M-mode): 2.4 cm      A Reversal Velocity: 40.80 cm/s Diastolic Velocity:  48.80 cm/s S/D Velocity:        1.50 Systolic Velocity:   73.40 cm/s  LEFT ATRIUM             Index        RIGHT ATRIUM           Index LA diam:        4.50 cm 2.22 cm/m   RA Area:     12.30 cm LA Vol (A2C):   70.9 ml 34.91 ml/m  RA Volume:   30.20 ml  14.87 ml/m LA Vol (A4C):   86.8 ml 42.73 ml/m LA Biplane Vol: 80.6 ml 39.68 ml/m AORTIC VALVE LVOT Vmax:   108.00 cm/s LVOT Vmean:  72.800 cm/s LVOT VTI:    0.269 m  AORTA Ao Root diam: 2.80 cm Ao Asc diam:  3.20 cm  MITRAL VALVE                TRICUSPID VALVE MV Area (PHT): 3.42 cm     TR Peak grad:   26.2 mmHg MV Decel Time: 222 msec     TR Vmax:        256.00 cm/s MV E velocity: 101.00 cm/s MV A velocity: 77.60 cm/s   SHUNTS MV E/A ratio:  1.30         Systemic VTI:  0.27 m Systemic Diam: 2.20 cm  Franck Azobou Tonleu Electronically signed by Joelle Cedars Tonleu Signature Date/Time: 09/17/2024/1:15:37 PM    Final          ______________________________________________________________________________________________      Risk Assessment/Calculations           Physical Exam VS:  BP 138/86   Pulse (!) 49   Ht 5' 7 (1.702 m)   Wt 202 lb 1.3 oz (91.7 kg)   SpO2 94%   BMI 31.65  kg/m        Wt Readings from Last 3 Encounters:  09/16/24 202 lb 1.3 oz (91.7 kg)  09/06/24 195 lb (88.5 kg)  11/30/19 212 lb (96.2 kg)    GEN: Well nourished, well developed in no acute distress NECK: No JVD; No carotid bruits CARDIAC: RRR, no murmurs, rubs, gallops RESPIRATORY:  Clear to auscultation without rales, wheezing or rhonchi  ABDOMEN: Soft, non-tender, non-distended EXTREMITIES:  No edema; No deformity   ASSESSMENT AND PLAN  Syncope: She reportedly had a history of bradycardia, heart rate is in the high 40s.  Her PCP has recently placed a heart monitor on her on 09/14/2024.  Unfortunately she is scheduled to fly back to [place] on 2/9, her monitor may not be back on time.  It would be advisable if possible to delay her flight for 2 more weeks.  So far she has had 1 syncope in July and had 2 near-syncope in August and recently.  I recommended echocardiogram.  I will follow-up on the heart monitor results.  I plan to bring the patient back in February once her heart monitor is completed.  Otherwise, she denies any exertional chest pain.  Suspicion for ischemia fairly low.       Dispo: Follow-up after heart monitor is back.  Signed, Scot Ford, PA  "

## 2024-09-17 ENCOUNTER — Ambulatory Visit (HOSPITAL_COMMUNITY)
Admission: RE | Admit: 2024-09-17 | Discharge: 2024-09-17 | Disposition: A | Discharge status: Home / Self Care | Source: Ambulatory Visit | Attending: Physician Assistant | Admitting: Physician Assistant

## 2024-09-17 DIAGNOSIS — R55 Syncope and collapse: Secondary | ICD-10-CM | POA: Diagnosis present

## 2024-09-17 LAB — ECHOCARDIOGRAM COMPLETE
Area-P 1/2: 3.42 cm2
S' Lateral: 2.9 cm

## 2024-09-17 MED ORDER — PERFLUTREN LIPID MICROSPHERE
1.0000 mL | INTRAVENOUS | Status: AC | PRN
  Administered 2024-09-17: 2 mL via INTRAVENOUS

## 2024-09-21 ENCOUNTER — Ambulatory Visit: Payer: Self-pay | Admitting: Physician Assistant

## 2024-09-28 ENCOUNTER — Telehealth (HOSPITAL_BASED_OUTPATIENT_CLINIC_OR_DEPARTMENT_OTHER): Payer: Self-pay

## 2024-09-28 NOTE — Telephone Encounter (Signed)
"  ° °  Pre-operative Risk Assessment    Patient Name: Deanna Reese  DOB: March 09, 1951 MRN: [id number]   Date of last office visit: 09/16/24 with Janene Date of next office visit: 10/06/24 with [facility]  Request for Surgical Clearance    Procedure:  Colonoscopy   Date of Surgery:  Clearance 10/01/24     *URGENT *                            Surgeon:  Dr. Kriss Socks Group or Practice Name:  [facility]  Phone number:  [phone] Fax number:  [phone]   Type of Clearance Requested:   - Medical    Type of Anesthesia:  propofol    Additional requests/questions:    [name]   09/28/2024, 4:29 PM   "

## 2024-09-29 NOTE — Telephone Encounter (Signed)
"  ° °  Patient Name: Deanna Reese  DOB: Jun 13, 1951 MRN: [id number]  Primary Cardiologist: None  Chart reviewed as part of pre-operative protocol coverage. Given past medical history and time since last visit, based on ACC/AHA guidelines, Mckensi K Mickelsen is at acceptable risk for the planned procedure without further cardiovascular testing.   The patient was advised that if she develops new symptoms prior to surgery to contact our office to arrange for a follow-up visit, and she verbalized understanding.  I will route this recommendation to the requesting party via Epic fax function and remove from pre-op pool.  Please call with questions.  Benyamin Jeff, [place] 09/29/2024, 8:24 AM  "

## 2024-09-29 NOTE — Telephone Encounter (Signed)
 Patient recently seen for office visit 09/16/2024 by Scot Ford, PA-C.  Will review with Hao to see if he can provide clearance for patient based upon that visit.

## 2024-10-06 ENCOUNTER — Encounter: Payer: Self-pay | Admitting: Physician Assistant

## 2024-10-06 ENCOUNTER — Ambulatory Visit: Admitting: Physician Assistant

## 2024-10-06 VITALS — BP 124/82 | HR 65 | Ht 67.0 in | Wt 201.0 lb

## 2024-10-06 DIAGNOSIS — R55 Syncope and collapse: Secondary | ICD-10-CM | POA: Diagnosis not present

## 2024-10-06 NOTE — Progress Notes (Unsigned)
 " Cardiology Office Note   Date:  10/07/2024  ID:  Darnella, Zeiter 11/05/1950, MRN [id number] PCP: Aisha Harvey, MD  [facility] Providers Cardiologist:  [facility]   History of Present Illness Deanna Reese is a 74 y.o. female with past medical history of hyperlipidemia, vitamin D  deficiency, mild persistent asthma and osteoarthritis.  Based on referral note from PCPs office, it appears patient started complaining of dizziness in July.  She described flushing sensation and claimed to have a history of bradycardia.  She described the symptom as recurrent since July.  So far she had 3 episodes of near passing out spell.  There has been no reported seizure-like activity.  Orthostatic vital sign was negative in PCPs office. Patient was recently sent to the emergency room on 09/06/2024 for chest pain and near syncope.  CBC and basic metabolic panel were normal.  Troponin negative x 2.  Chest x-ray showed subtle nodular density over the left upper lobe, CT of the chest recommended to further evaluate.  Patient was recommended to proceed with outpatient evaluation with cardiology service.  I last saw the [facility] in [facility] on 09/16/2024.  According to the patient, she had a true syncope in July 2025, it did not occur with body position changes.  She was walking around when she had a syncope.  After that, she had an episode of near syncope in August that lasted 1 to 2 minutes.  Symptom eased off for several months however recurred again recently.  EKG showed questionable Q wave in the inferior lead.  She reported prior history of bradycardia, heart rate was 49 bpm when I saw her.  By the time I saw her, primary care provider has already placed a heart monitor on the patient.  I recommended echocardiogram to make sure her wall motion is normal and her EF is normal.  Of note, she has upcoming flight back to [place].  Echocardiogram obtained on 09/17/2024 showed EF 60 to 65%, no regional wall motion  abnormality, grade 2 DD, normal RV, mildly enlarged RV, mild MR.  Overall reassuring echocardiogram.   Patient presents today for follow-up.  She started wearing the heart monitor on January 13 and finished it on January 22.  I have called [facility] to request the result of the heart monitor.  I reviewed the echocardiogram with the patient and echocardiogram was reassuring.  She continued to deny any chest pain or shortness of breath.  While wearing the heart monitor, she had 3 more episodes of near passing out spells.  She did do manual trigger to document those spells.  The day after she returned the heart monitor, she had one more episode.  She has not had any more episode in the past 10 days.  The recurrence of the symptom concerns me.  I will follow-up on the heart monitor today or tomorrow.  Depend on the result of the heart monitor, I will make further recommendations.  I did give her a Airline note as I suspect she will need additional evaluation that require her to cancel her flight.  If heart monitor is normal, she will need neurology evaluation, ideally in [place]  instead of traveling back to [place].  Her original flight is scheduled for next Monday.   ROS:   Patient continued to have presyncope, this preceded by flushing sensation and significant dizziness.  She has not passed out since the last visit.  She denies any chest pain, shortness of breath, lower extremity  edema, orthopnea or PND.  Studies Reviewed      Cardiac Studies & Procedures   ______________________________________________________________________________________________     ECHOCARDIOGRAM  ECHOCARDIOGRAM COMPLETE 09/17/2024  Narrative ECHOCARDIOGRAM REPORT    Patient Name:   Deanna Reese Date of Exam: 09/17/2024 Medical Rec #:  [id number]       Height:       67.0 in Accession #:    [id number]      Weight:       202.1 lb Date of Birth:  [birth year]      BSA:          2.031 m Patient Age:    73  years        BP:           155/96 mmHg Patient Gender: F               HR:           57 bpm. Exam Location:  [address]  Procedure: 2D Echo, Cardiac Doppler, Color Doppler and Intracardiac Opacification Agent (Both Spectral and Color Flow Doppler were utilized during procedure).  Indications:    R07.9 Chest pain  History:        Patient has no prior history of Echocardiogram examinations. Signs/Symptoms:Chest Pain and Near syncope; Risk Factors:Dyslipidemia.  Sonographer:    Nolon Berg BA, RDCS Referring Phys: [phone] Hazle Ogburn  IMPRESSIONS   1. Left ventricular ejection fraction, by estimation, is 60 to 65%. The left ventricle has normal function. The left ventricle has no regional wall motion abnormalities. Left ventricular diastolic parameters are consistent with Grade II diastolic dysfunction (pseudonormalization). 2. Right ventricular systolic function is normal. The right ventricular size is mildly enlarged. 3. Left atrial size was mildly dilated. 4. The mitral valve is normal in structure. Mild mitral valve regurgitation. No evidence of mitral stenosis. 5. The aortic valve is normal in structure. Aortic valve regurgitation is not visualized. No aortic stenosis is present. 6. The inferior vena cava is normal in size with greater than 50% respiratory variability, suggesting right atrial pressure of 3 mmHg.  Comparison(s): No prior Echocardiogram.  FINDINGS Left Ventricle: Left ventricular ejection fraction, by estimation, is 60 to 65%. The left ventricle has normal function. The left ventricle has no regional wall motion abnormalities. The left ventricular internal cavity size was normal in size. There is no left ventricular hypertrophy. Left ventricular diastolic parameters are consistent with Grade II diastolic dysfunction (pseudonormalization).  Right Ventricle: The right ventricular size is mildly enlarged. No increase in right ventricular wall thickness. Right  ventricular systolic function is normal.  Left Atrium: Left atrial size was mildly dilated.  Right Atrium: Right atrial size was normal in size.  Pericardium: There is no evidence of pericardial effusion.  Mitral Valve: The mitral valve is normal in structure. Mild mitral valve regurgitation. No evidence of mitral valve stenosis.  Tricuspid Valve: The tricuspid valve is normal in structure. Tricuspid valve regurgitation is not demonstrated. No evidence of tricuspid stenosis.  Aortic Valve: The aortic valve is normal in structure. Aortic valve regurgitation is not visualized. No aortic stenosis is present.  Pulmonic Valve: The pulmonic valve was normal in structure. Pulmonic valve regurgitation is not visualized. No evidence of pulmonic stenosis.  Aorta: The aortic root is normal in size and structure.  Venous: The inferior vena cava is normal in size with greater than 50% respiratory variability, suggesting right atrial pressure of 3 mmHg.  IAS/Shunts: No atrial level shunt detected by color  flow Doppler.   LEFT VENTRICLE PLAX 2D LVIDd:         5.00 cm   Diastology LVIDs:         2.90 cm   LV e' medial:    5.54 cm/s LV PW:         0.90 cm   LV E/e' medial:  18.2 LV IVS:        0.90 cm   LV e' lateral:   8.87 cm/s LVOT diam:     2.20 cm   LV E/e' lateral: 11.4 LV SV:         102 LV SV Index:   50 LVOT Area:     3.80 cm LV IVRT:       77 msec   RIGHT VENTRICLE             IVC RV Basal diam:  4.10 cm     IVC diam: 2.00 cm RV Mid diam:    3.40 cm RV S prime:     16.95 cm/s  PULMONARY VEINS TAPSE (M-mode): 2.4 cm      A Reversal Velocity: 40.80 cm/s Diastolic Velocity:  48.80 cm/s S/D Velocity:        1.50 Systolic Velocity:   73.40 cm/s  LEFT ATRIUM             Index        RIGHT ATRIUM           Index LA diam:        4.50 cm 2.22 cm/m   RA Area:     12.30 cm LA Vol (A2C):   70.9 ml 34.91 ml/m  RA Volume:   30.20 ml  14.87 ml/m LA Vol (A4C):   86.8 ml 42.73 ml/m LA  Biplane Vol: 80.6 ml 39.68 ml/m AORTIC VALVE LVOT Vmax:   108.00 cm/s LVOT Vmean:  72.800 cm/s LVOT VTI:    0.269 m  AORTA Ao Root diam: 2.80 cm Ao Asc diam:  3.20 cm  MITRAL VALVE                TRICUSPID VALVE MV Area (PHT): 3.42 cm     TR Peak grad:   26.2 mmHg MV Decel Time: 222 msec     TR Vmax:        256.00 cm/s MV E velocity: 101.00 cm/s MV A velocity: 77.60 cm/s   SHUNTS MV E/A ratio:  1.30         Systemic VTI:  0.27 m Systemic Diam: 2.20 cm  Franck Azobou Tonleu Electronically signed by Joelle Cedars Tonleu Signature Date/Time: 09/17/2024/1:15:37 PM    Final          ______________________________________________________________________________________________      Risk Assessment/Calculations           Physical Exam VS:  BP 124/82 (BP Location: Right Arm, Patient Position: Sitting, Cuff Size: Normal)   Pulse 65   Ht 5' 7 (1.702 m)   Wt 201 lb (91.2 kg)   SpO2 95%   BMI 31.48 kg/m        Wt Readings from Last 3 Encounters:  10/06/24 201 lb (91.2 kg)  09/16/24 202 lb 1.3 oz (91.7 kg)  09/06/24 195 lb (88.5 kg)    GEN: Well nourished, well developed in no acute distress NECK: No JVD; No carotid bruits CARDIAC: RRR, no murmurs, rubs, gallops RESPIRATORY:  Clear to auscultation without rales, wheezing or rhonchi  ABDOMEN: Soft, non-tender, non-distended EXTREMITIES:  No edema;  No deformity   ASSESSMENT AND PLAN  Syncope: She had a episode of syncope in July 2025, since then, she had multiple presyncope in July 2025, November, January and while she was wearing her heart monitor.  She had 3 episodes of presyncope on the heart monitor and did manual trigger.  Her echocardiogram was reassuring.  I am concerned of arrhythmia or pauses as a potential cause given recurrence.  She is currently scheduled to fly back to [place] on Monday.  I would advise her to complete her evaluation here before taking an international flight, this may include canceling  her flight.  I will try to get the heart monitor result in the next few days and to review this with her.  If no significant finding on the heart monitor, I would recommend a neurology evaluation.  Addendum 10/08/2024: We were able to secure the heart monitor report after contacting [facility] physicians.  I have called and spoke with Mrs. Feagans over the phone and reviewed the raw report.  Minimal heart rate 46 bpm, average heart rate 70 bpm, maximal heart rate 211 bpm.  Predominant rhythm was sinus rhythm, there were 311 episode of SVT ranges from 4 beats with heart rate of 211 bpm up to 32.7 beats with a heart rate of 127 bpm.  PAC burden 5.0%, PVC burden less than 1%.  No significant ventricular tachycardia or ventricular fibrillation.  No atrial fibrillation or atrial flutter.  No significant pauses or bradycardia.  There were 3 patient triggered events and 3 diary events, correlating with the patient's description of 3 episodes of dizziness while on the heart monitor.  The first episode occurred on 09/15/2024, underlying rhythm was sinus tachycardia with heart rate of 126 bpm.  Heart rate quickly settled down to 75 bpm within 3 minutes.  Second episode was on 09/20/2024, underlying rhythm was sinus rhythm with heart rate of 61 bpm, rare PVCs.  Third episode was on 09/21/2024, underlying rhythm was sinus rhythm with short burst of SVT afterward.  Symptom lasted less than 1 minute.  Patient was laying in bed at the time.  We did not see any prolonged SVT with severe tachycardia that can make people pass out or feel like passing out.  I offered a low-dose of beta-blocker to potentially help mask the symptom, this medication is more for symptom control rather than to prevent harm.  There was no significant arrhythmia to explain the near passing out spell or passing out spell.  I recommend she establish with a primary care provider and potentially undergo additional neurology evaluation once she arrives in  [place].       Dispo: Follow-up based on the heart monitor result  Signed, Scot Ford, PA  "

## 2024-10-06 NOTE — Patient Instructions (Signed)
 Medication Instructions:  NO CHANGES *If you need a refill on your cardiac medications before your next appointment, please call your pharmacy*  Lab Work: NO LABS If you have labs (blood work) drawn today and your tests are completely normal, you will receive your results only by: MyChart Message (if you have MyChart) OR A paper copy in the mail If you have any lab test that is abnormal or we need to change your treatment, we will call you to review the results.  Testing/Procedures: NO TESTING  Follow-Up: At [facility], you and your health needs are our priority.  As part of our continuing mission to provide you with exceptional heart care, our providers are all part of one team.  This team includes your primary Cardiologist (physician) and Advanced Practice Providers or APPs (Physician Assistants and Nurse Practitioners) who all work together to provide you with the care you need, when you need it.  Your next appointment:   FOLLOW UP BASED ON HEART MONITOR RESULTS  Other Instructions
